# Patient Record
Sex: Female | Born: 1960 | Race: White | Hispanic: No | Marital: Married
Health system: Southern US, Community
[De-identification: ages and names within clinical notes are randomized; demographics above are authoritative.]

## PROBLEM LIST (undated history)

## (undated) DIAGNOSIS — I639 Cerebral infarction, unspecified: Secondary | ICD-10-CM

## (undated) DIAGNOSIS — H269 Unspecified cataract: Secondary | ICD-10-CM

## (undated) DIAGNOSIS — C50919 Malignant neoplasm of unspecified site of unspecified female breast: Secondary | ICD-10-CM

## (undated) DIAGNOSIS — E119 Type 2 diabetes mellitus without complications: Secondary | ICD-10-CM

## (undated) DIAGNOSIS — I10 Essential (primary) hypertension: Secondary | ICD-10-CM

## (undated) HISTORY — PX: TUBAL LIGATION: SHX77

## (undated) HISTORY — DX: Unspecified cataract: H26.9

## (undated) HISTORY — DX: Cerebral infarction, unspecified: I63.9

## (undated) HISTORY — PX: BREAST EXCISIONAL BIOPSY: SUR124

## (undated) HISTORY — PX: ABDOMINAL HYSTERECTOMY: SHX81

## (undated) HISTORY — DX: Type 2 diabetes mellitus without complications: E11.9

## (undated) HISTORY — PX: BREAST SURGERY: SHX581

## (undated) HISTORY — DX: Essential (primary) hypertension: I10

## (undated) HISTORY — PX: REDUCTION MAMMAPLASTY: SUR839

## (undated) HISTORY — DX: Malignant neoplasm of unspecified site of unspecified female breast: C50.919

## (undated) HISTORY — PX: MASTECTOMY: SHX3

---

## 2003-01-25 HISTORY — PX: BUNIONECTOMY: SHX129

## 2004-05-20 ENCOUNTER — Ambulatory Visit: Payer: Self-pay | Admitting: Podiatry

## 2012-07-26 DIAGNOSIS — N951 Menopausal and female climacteric states: Secondary | ICD-10-CM | POA: Insufficient documentation

## 2012-07-26 DIAGNOSIS — L570 Actinic keratosis: Secondary | ICD-10-CM | POA: Insufficient documentation

## 2012-07-26 DIAGNOSIS — Z86000 Personal history of in-situ neoplasm of breast: Secondary | ICD-10-CM | POA: Insufficient documentation

## 2012-07-26 DIAGNOSIS — I10 Essential (primary) hypertension: Secondary | ICD-10-CM | POA: Insufficient documentation

## 2012-07-26 DIAGNOSIS — E785 Hyperlipidemia, unspecified: Secondary | ICD-10-CM | POA: Insufficient documentation

## 2012-07-26 DIAGNOSIS — Z9889 Other specified postprocedural states: Secondary | ICD-10-CM | POA: Insufficient documentation

## 2012-07-26 DIAGNOSIS — E1169 Type 2 diabetes mellitus with other specified complication: Secondary | ICD-10-CM | POA: Insufficient documentation

## 2012-07-26 DIAGNOSIS — E11319 Type 2 diabetes mellitus with unspecified diabetic retinopathy without macular edema: Secondary | ICD-10-CM | POA: Insufficient documentation

## 2012-11-01 DIAGNOSIS — Z87891 Personal history of nicotine dependence: Secondary | ICD-10-CM | POA: Insufficient documentation

## 2014-01-28 DIAGNOSIS — D219 Benign neoplasm of connective and other soft tissue, unspecified: Secondary | ICD-10-CM | POA: Insufficient documentation

## 2014-07-25 HISTORY — PX: RECONSTRUCTION BREAST W/ TRAM FLAP: SUR1079

## 2014-09-16 DIAGNOSIS — Z9889 Other specified postprocedural states: Secondary | ICD-10-CM | POA: Insufficient documentation

## 2015-02-17 LAB — HM PAP SMEAR

## 2015-08-27 DIAGNOSIS — E669 Obesity, unspecified: Secondary | ICD-10-CM | POA: Insufficient documentation

## 2015-08-27 DIAGNOSIS — E6609 Other obesity due to excess calories: Secondary | ICD-10-CM | POA: Insufficient documentation

## 2015-11-25 DIAGNOSIS — Z90710 Acquired absence of both cervix and uterus: Secondary | ICD-10-CM | POA: Insufficient documentation

## 2015-12-31 DIAGNOSIS — Z8669 Personal history of other diseases of the nervous system and sense organs: Secondary | ICD-10-CM | POA: Insufficient documentation

## 2016-07-05 DIAGNOSIS — L819 Disorder of pigmentation, unspecified: Secondary | ICD-10-CM | POA: Insufficient documentation

## 2016-07-05 DIAGNOSIS — K219 Gastro-esophageal reflux disease without esophagitis: Secondary | ICD-10-CM | POA: Insufficient documentation

## 2016-07-05 DIAGNOSIS — K5730 Diverticulosis of large intestine without perforation or abscess without bleeding: Secondary | ICD-10-CM | POA: Insufficient documentation

## 2016-07-05 DIAGNOSIS — J410 Simple chronic bronchitis: Secondary | ICD-10-CM | POA: Insufficient documentation

## 2017-04-14 LAB — HM HEPATITIS C SCREENING LAB: HM HEPATITIS C SCREENING: NEGATIVE

## 2017-04-14 LAB — HEMOGLOBIN A1C: Hemoglobin A1C: 6.6

## 2017-04-14 LAB — LIPID PANEL
Cholesterol: 116 (ref 0–200)
HDL: 58 (ref 35–70)
LDL Cholesterol: 36
Triglycerides: 112 (ref 40–160)

## 2017-07-19 LAB — HM MAMMOGRAPHY

## 2018-02-19 ENCOUNTER — Ambulatory Visit (INDEPENDENT_AMBULATORY_CARE_PROVIDER_SITE_OTHER): Payer: 59 | Admitting: Family Medicine

## 2018-02-19 ENCOUNTER — Encounter: Payer: Self-pay | Admitting: Family Medicine

## 2018-02-19 VITALS — BP 117/70 | HR 84 | Temp 98.1°F | Ht 62.5 in | Wt 221.6 lb

## 2018-02-19 DIAGNOSIS — Z9889 Other specified postprocedural states: Secondary | ICD-10-CM

## 2018-02-19 DIAGNOSIS — E11319 Type 2 diabetes mellitus with unspecified diabetic retinopathy without macular edema: Secondary | ICD-10-CM | POA: Diagnosis not present

## 2018-02-19 DIAGNOSIS — Z86010 Personal history of colonic polyps: Secondary | ICD-10-CM

## 2018-02-19 DIAGNOSIS — E785 Hyperlipidemia, unspecified: Secondary | ICD-10-CM

## 2018-02-19 DIAGNOSIS — Z87891 Personal history of nicotine dependence: Secondary | ICD-10-CM

## 2018-02-19 DIAGNOSIS — E1169 Type 2 diabetes mellitus with other specified complication: Secondary | ICD-10-CM | POA: Diagnosis not present

## 2018-02-19 DIAGNOSIS — N951 Menopausal and female climacteric states: Secondary | ICD-10-CM

## 2018-02-19 DIAGNOSIS — E669 Obesity, unspecified: Secondary | ICD-10-CM | POA: Diagnosis not present

## 2018-02-19 DIAGNOSIS — Z1211 Encounter for screening for malignant neoplasm of colon: Secondary | ICD-10-CM

## 2018-02-19 DIAGNOSIS — I10 Essential (primary) hypertension: Secondary | ICD-10-CM

## 2018-02-19 MED ORDER — INSULIN DEGLUDEC-LIRAGLUTIDE 100-3.6 UNIT-MG/ML ~~LOC~~ SOPN: 25.0000 [IU] | PEN_INJECTOR | Freq: Every day | SUBCUTANEOUS | 3 refills | Status: DC

## 2018-02-19 MED ORDER — DULAGLUTIDE 1.5 MG/0.5ML ~~LOC~~ SOAJ: 1.5000 mg | SUBCUTANEOUS | 3 refills | Status: DC

## 2018-02-19 NOTE — Patient Instructions (Signed)
Diabetes Mellitus and Nutrition, Adult  When you have diabetes (diabetes mellitus), it is very important to have healthy eating habits because your blood sugar (glucose) levels are greatly affected by what you eat and drink. Eating healthy foods in the appropriate amounts, at about the same times every day, can help you:  · Control your blood glucose.  · Lower your risk of heart disease.  · Improve your blood pressure.  · Reach or maintain a healthy weight.  Every person with diabetes is different, and each person has different needs for a meal plan. Your health care provider may recommend that you work with a diet and nutrition specialist (dietitian) to make a meal plan that is best for you. Your meal plan may vary depending on factors such as:  · The calories you need.  · The medicines you take.  · Your weight.  · Your blood glucose, blood pressure, and cholesterol levels.  · Your activity level.  · Other health conditions you have, such as heart or kidney disease.  How do carbohydrates affect me?  Carbohydrates, also called carbs, affect your blood glucose level more than any other type of food. Eating carbs naturally raises the amount of glucose in your blood. Carb counting is a method for keeping track of how many carbs you eat. Counting carbs is important to keep your blood glucose at a healthy level, especially if you use insulin or take certain oral diabetes medicines.  It is important to know how many carbs you can safely have in each meal. This is different for every person. Your dietitian can help you calculate how many carbs you should have at each meal and for each snack.  Foods that contain carbs include:  · Bread, cereal, rice, pasta, and crackers.  · Potatoes and corn.  · Peas, beans, and lentils.  · Milk and yogurt.  · Fruit and juice.  · Desserts, such as cakes, cookies, ice cream, and candy.  How does alcohol affect me?  Alcohol can cause a sudden decrease in blood glucose (hypoglycemia),  especially if you use insulin or take certain oral diabetes medicines. Hypoglycemia can be a life-threatening condition. Symptoms of hypoglycemia (sleepiness, dizziness, and confusion) are similar to symptoms of having too much alcohol.  If your health care provider says that alcohol is safe for you, follow these guidelines:  · Limit alcohol intake to no more than 1 drink per day for nonpregnant women and 2 drinks per day for men. One drink equals 12 oz of beer, 5 oz of wine, or 1½ oz of hard liquor.  · Do not drink on an empty stomach.  · Keep yourself hydrated with water, diet soda, or unsweetened iced tea.  · Keep in mind that regular soda, juice, and other mixers may contain a lot of sugar and must be counted as carbs.  What are tips for following this plan?    Reading food labels  · Start by checking the serving size on the "Nutrition Facts" label of packaged foods and drinks. The amount of calories, carbs, fats, and other nutrients listed on the label is based on one serving of the item. Many items contain more than one serving per package.  · Check the total grams (g) of carbs in one serving. You can calculate the number of servings of carbs in one serving by dividing the total carbs by 15. For example, if a food has 30 g of total carbs, it would be equal to 2   servings of carbs.  · Check the number of grams (g) of saturated and trans fats in one serving. Choose foods that have low or no amount of these fats.  · Check the number of milligrams (mg) of salt (sodium) in one serving. Most people should limit total sodium intake to less than 2,300 mg per day.  · Always check the nutrition information of foods labeled as "low-fat" or "nonfat". These foods may be higher in added sugar or refined carbs and should be avoided.  · Talk to your dietitian to identify your daily goals for nutrients listed on the label.  Shopping  · Avoid buying canned, premade, or processed foods. These foods tend to be high in fat, sodium,  and added sugar.  · Shop around the outside edge of the grocery store. This includes fresh fruits and vegetables, bulk grains, fresh meats, and fresh dairy.  Cooking  · Use low-heat cooking methods, such as baking, instead of high-heat cooking methods like deep frying.  · Cook using healthy oils, such as olive, canola, or sunflower oil.  · Avoid cooking with butter, cream, or high-fat meats.  Meal planning  · Eat meals and snacks regularly, preferably at the same times every day. Avoid going long periods of time without eating.  · Eat foods high in fiber, such as fresh fruits, vegetables, beans, and whole grains. Talk to your dietitian about how many servings of carbs you can eat at each meal.  · Eat 4-6 ounces (oz) of lean protein each day, such as lean meat, chicken, fish, eggs, or tofu. One oz of lean protein is equal to:  ? 1 oz of meat, chicken, or fish.  ? 1 egg.  ? ¼ cup of tofu.  · Eat some foods each day that contain healthy fats, such as avocado, nuts, seeds, and fish.  Lifestyle  · Check your blood glucose regularly.  · Exercise regularly as told by your health care provider. This may include:  ? 150 minutes of moderate-intensity or vigorous-intensity exercise each week. This could be brisk walking, biking, or water aerobics.  ? Stretching and doing strength exercises, such as yoga or weightlifting, at least 2 times a week.  · Take medicines as told by your health care provider.  · Do not use any products that contain nicotine or tobacco, such as cigarettes and e-cigarettes. If you need help quitting, ask your health care provider.  · Work with a counselor or diabetes educator to identify strategies to manage stress and any emotional and social challenges.  Questions to ask a health care provider  · Do I need to meet with a diabetes educator?  · Do I need to meet with a dietitian?  · What number can I call if I have questions?  · When are the best times to check my blood glucose?  Where to find more  information:  · American Diabetes Association: diabetes.org  · Academy of Nutrition and Dietetics: www.eatright.org  · National Institute of Diabetes and Digestive and Kidney Diseases (NIH): www.niddk.nih.gov  Summary  · A healthy meal plan will help you control your blood glucose and maintain a healthy lifestyle.  · Working with a diet and nutrition specialist (dietitian) can help you make a meal plan that is best for you.  · Keep in mind that carbohydrates (carbs) and alcohol have immediate effects on your blood glucose levels. It is important to count carbs and to use alcohol carefully.  This information is not intended to   replace advice given to you by your health care provider. Make sure you discuss any questions you have with your health care provider.  Document Released: 10/07/2004 Document Revised: 08/10/2016 Document Reviewed: 02/15/2016  Elsevier Interactive Patient Education © 2019 Elsevier Inc.

## 2018-02-19 NOTE — Progress Notes (Signed)
Patient: Melissa Hayes, Female    DOB: 1960/08/30, 58 y.o.   MRN: [id number] Visit Date: 02/19/2018  Today's Provider: Lavon Paganini, MD   Chief Complaint  Patient presents with  . Establish Care   Subjective:  I, Tiburcio Pea, CMA, am acting as a scribe for Lavon Paganini, MD.    New Patient:  Melissa Hayes is a 58 y.o. female who presents today to Establish Care. She was previously a patient at [facility] in [place].  She feels well. She reports exercising lightly. She reports she is sleeping well.  Patient states that her main medical concern is her obesity.  She would like to lose weight.  Since moving within the last few months, she has been eating out more.  She is trying to cook more at home and eat less outside the house.  She is not exercising currently.  She feels like it is warm she is able to walk outside.  She feels like it is too far to go to the gym from her house.  She also admits that she was out of her insulin pen needles for several months after her move and therefore did not take her insulin during that time.  She has been back on this for about 1 month.  She tried Contrave in the past and found that this helped her quit smoking, but did not help with weight loss.  She was previously taking Bydureon and found this helpful.  Patient has a history of breast cancer in 1999.  She underwent a right mastectomy.  She did not require any chemo or radiation.  She has been cleared by oncology.  She does get annual mammograms.  She underwent reconstructive surgery requiring a flap that had several complications including an abscess.  She does still have some intermittent pain around the scarring that feels like a pulling sensation.  Patient has a history of stroke in 2016 with some vision loss in her right eye.  She also has cataracts, but they are waiting to do surgery given that she already has vision loss in one eye from the  stroke.  Patient has a history of hysterectomy, which may have been supracervical.  This was done for benign indications, DU B.  She has had Pap smears since that time and has no history of abnormal Pap smears  T2DM - Checking BG at home: Not currently - Medications: Insulin degludec-liraglutide 100-3.6 units-MGs per mL 0.25 mL's daily at bedtime, metformin 1000 mg twice daily, Actos 45 mg daily - Compliance: Good, but she was out of her insulin combo pen for about 2 months.  She has been back on this for 1 month - Diet: Working toward low-carb diet again - Exercise: None currently - eye exam: needs referral to Opth - foot exam: needs - microalbumin: on ARB - denies symptoms of hypoglycemia, polyuria, polydipsia, numbness extremities, foot ulcers/trauma  HTN: - Medications: Valsartan- HCTZ 160-25mg  daily - Compliance: good - Checking BP at home: no - Denies any SOB, CP, vision changes, LE edema, medication SEs, or symptoms of hypotension  Patient has hot flashes but not a candidate for HRT.  She is taking Paxil 10mg  daily.  -----------------------------------------------------------------   Review of Systems  Constitutional: Negative.   HENT: Negative.   Eyes: Negative.   Respiratory: Negative.   Cardiovascular: Negative.   Gastrointestinal: Negative.   Endocrine: Negative.   Genitourinary: Negative.   Musculoskeletal: Negative.   Skin: Negative.  Allergic/Immunologic: Negative.   Neurological: Negative.   Hematological: Negative.   Psychiatric/Behavioral: Negative.     Social History      She  reports that she has quit smoking. Her smoking use included cigarettes. She has never used smokeless tobacco. She reports previous alcohol use. She reports that she does not use drugs.       Social History   Socioeconomic History  . Marital status: Married    Spouse name: Not on file  . Number of children: Not on file  . Years of education: Not on file  . Highest education  level: Not on file  Occupational History  . Not on file  Social Needs  . Financial resource strain: Not on file  . Food insecurity:    Worry: Not on file    Inability: Not on file  . Transportation needs:    Medical: Not on file    Non-medical: Not on file  Tobacco Use  . Smoking status: Former Smoker    Types: Cigarettes  . Smokeless tobacco: Never Used  Substance and Sexual Activity  . Alcohol use: Not Currently  . Drug use: Never  . Sexual activity: Not on file  Lifestyle  . Physical activity:    Days per week: Not on file    Minutes per session: Not on file  . Stress: Not on file  Relationships  . Social connections:    Talks on phone: Not on file    Gets together: Not on file    Attends religious service: Not on file    Active member of club or organization: Not on file    Attends meetings of clubs or organizations: Not on file    Relationship status: Not on file  Other Topics Concern  . Not on file  Social History Narrative  . Not on file    Past Medical History:  Diagnosis Date  . Cancer ([place])   . Cataract   . Hypertension   . Stroke [facility])      Patient Active Problem List   Diagnosis Date Noted  . Diabetes mellitus type 2 in obese ([place]) 02/19/2018  . Diverticulosis of colon 07/05/2016  . Gastroesophageal reflux disease without esophagitis 07/05/2016  . Simple chronic bronchitis ([place]) 07/05/2016  . Change in pigmented skin lesion of face 07/05/2016  . History of central retinal artery occlusion 12/31/2015  . Hx of hysterectomy, total 11/25/2015  . Non morbid obesity due to excess calories 08/27/2015  . 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 Fibroids 01/28/2014  . Former smoker 11/01/2012  . Diabetic retinopathy ([place]) 07/26/2012  . H/O carcinoma in situ of breast 07/26/2012  . HTN (hypertension), benign 07/26/2012  . Hyperlipidemia LDL goal <100 07/26/2012  . Post menopausal syndrome 07/26/2012  . AK  (actinic keratosis) 07/26/2012  . H/O colonoscopy with polypectomy 07/26/2012    Past Surgical History:  Procedure Laterality Date  . ABDOMINAL HYSTERECTOMY    . BREAST SURGERY    . BUNIONECTOMY  2005  . CESAREAN SECTION    . RECONSTRUCTION BREAST W/ TRAM FLAP  07/2014  . TUBAL LIGATION      Family History        Family Status  Relation Name Status  . Mother  Alive  . Father  Alive  . Brother  Alive  . MGM  Deceased  . MGF  Deceased  . PGM  Deceased  . PGF  Deceased  Her family history includes Diabetes in her father, maternal grandmother, mother, and paternal grandmother; Hypertension in her father, maternal grandmother, mother, and paternal grandmother.      No Known Allergies   Current Outpatient Medications:  .  glucose blood test strip, USE 1 STRIP TO TEST BLOOD SUGAR DAILY AS DIRECTED., Disp: , Rfl:  .  Insulin Degludec-Liraglutide 100-3.6 UNIT-MG/ML SOPN, Inject into the skin., Disp: , Rfl:  .  metFORMIN (GLUCOPHAGE) 1000 MG tablet, , Disp: , Rfl:  .  PARoxetine (PAXIL) 10 MG tablet, , Disp: , Rfl:  .  pioglitazone (ACTOS) 45 MG tablet, , Disp: , Rfl:  .  rosuvastatin (CRESTOR) 20 MG tablet, TAKE 1 TABLET BY MOUTH EVERYDAY AT BEDTIME, Disp: , Rfl:  .  valsartan-hydrochlorothiazide (DIOVAN-HCT) 160-25 MG tablet, , Disp: , Rfl:    Patient Care Team: Virginia Crews, MD as PCP - General (Family Medicine)      Objective:   Vitals: BP 117/70 (BP Location: Right Arm, Patient Position: Sitting, Cuff Size: Large)   Pulse 84   Temp 98.1 F (36.7 C) (Oral)   Ht 5' 2.5" (1.588 m)   Wt 221 lb 9.6 oz (100.5 kg)   SpO2 99%   BMI 39.89 kg/m    Vitals:   02/19/18 1519  BP: 117/70  Pulse: 84  Temp: 98.1 F (36.7 C)  TempSrc: Oral  SpO2: 99%  Weight: 221 lb 9.6 oz (100.5 kg)  Height: 5' 2.5" (1.588 m)     Physical Exam Vitals signs reviewed.  Constitutional:      General: She is not in acute distress.    Appearance: Normal appearance. She is  well-developed. She is not diaphoretic.  HENT:     Head: Normocephalic and atraumatic.     Right Ear: Tympanic membrane, ear canal and external ear normal.     Left Ear: Tympanic membrane, ear canal and external ear normal.     Nose: Nose normal.     Mouth/Throat:     Mouth: Mucous membranes are moist.     Pharynx: Oropharynx is clear. No oropharyngeal exudate.  Eyes:     General: No scleral icterus.    Conjunctiva/sclera: Conjunctivae normal.     Pupils: Pupils are equal, round, and reactive to light.  Neck:     Musculoskeletal: Neck supple.     Thyroid: No thyromegaly.  Cardiovascular:     Rate and Rhythm: Normal rate and regular rhythm.     Pulses: Normal pulses.     Heart sounds: Normal heart sounds. No murmur.  Pulmonary:     Effort: Pulmonary effort is normal. No respiratory distress.     Breath sounds: Normal breath sounds. No wheezing or rales.  Abdominal:     General: Bowel sounds are normal. There is no distension.     Palpations: Abdomen is soft.     Tenderness: There is no abdominal tenderness. There is no guarding or rebound.  Musculoskeletal:        General: No deformity.     Right lower leg: No edema.     Left lower leg: No edema.  Lymphadenopathy:     Cervical: No cervical adenopathy.  Skin:    General: Skin is warm and dry.     Capillary Refill: Capillary refill takes less than 2 seconds.     Findings: No rash.  Neurological:     Mental Status: She is alert and oriented to person, place, and time. Mental status is at baseline.  Psychiatric:  Mood and Affect: Mood normal.        Behavior: Behavior normal.        Thought Content: Thought content normal.     Depression Screen PHQ 2/9 Scores 02/19/2018  PHQ - 2 Score 0  PHQ- 9 Score 0     Assessment & Plan:     Establish Care  Exercise Activities and Dietary recommendations Goals   None     Immunization History  Administered Date(s) Administered  . Influenza Split 12/30/2011  .  Influenza,inj,Quad PF,6+ Mos 01/07/2018  . Influenza,inj,quad, With Preservative 11/06/2014, 12/09/2015, 10/18/2016, 01/07/2018  . Influenza,trivalent, recombinat, inj, PF 11/01/2012, 11/06/2013  . Pneumococcal Conjugate-13 02/14/2014  . Pneumococcal Polysaccharide-23 05/25/2015  . Tdap 12/09/2015    Health Maintenance  Topic Date Due  . FOOT EXAM  02/03/1971  . OPHTHALMOLOGY EXAM  02/03/1971  . HIV Screening  02/03/1976  . COLONOSCOPY  02/03/2011  . HEMOGLOBIN A1C  10/15/2017  . PAP SMEAR-Modifier  02/16/2018  . MAMMOGRAM  07/20/2019  . TETANUS/TDAP  12/08/2025  . INFLUENZA VACCINE  Completed  . PNEUMOCOCCAL POLYSACCHARIDE VACCINE AGE 92-64 HIGH RISK  Completed  . Hepatitis C Screening  Completed     Discussed health benefits of physical activity, and encouraged her to engage in regular exercise appropriate for her age and condition.    --------------------------------------------------------------------  Problem List Items Addressed This Visit      Cardiovascular and Mediastinum   HTN (hypertension), benign - Primary    Well controlled Continue current medications Check CMP      Relevant Medications   rosuvastatin (CRESTOR) 20 MG tablet   valsartan-hydrochlorothiazide (DIOVAN-HCT) 160-25 MG tablet   aspirin EC 81 MG tablet   Other Relevant Orders   Comprehensive metabolic panel (Completed)     Endocrine   Diabetes mellitus type 2 in obese [facility])    previously well controlled Recheck A1c Foot exam at next visit Referral to Optho for annual eye exam Continue GLP1, insulin, metformin Will d/c Actos and add Jardiance to promote weight loss May be able to decrease or d/c insulin in the future Encourage exercise and low carb diet      Relevant Medications   metFORMIN (GLUCOPHAGE) 1000 MG tablet   rosuvastatin (CRESTOR) 20 MG tablet   valsartan-hydrochlorothiazide (DIOVAN-HCT) 160-25 MG tablet   aspirin EC 81 MG tablet   Insulin Degludec-Liraglutide 100-3.6  UNIT-MG/ML SOPN   empagliflozin (JARDIANCE) 10 MG TABS tablet   Other Relevant Orders   Hemoglobin A1c (Completed)   Diabetic retinopathy ([place])    Referral to Optho      Relevant Medications   metFORMIN (GLUCOPHAGE) 1000 MG tablet   rosuvastatin (CRESTOR) 20 MG tablet   valsartan-hydrochlorothiazide (DIOVAN-HCT) 160-25 MG tablet   aspirin EC 81 MG tablet   Insulin Degludec-Liraglutide 100-3.6 UNIT-MG/ML SOPN   empagliflozin (JARDIANCE) 10 MG TABS tablet   Other Relevant Orders   Ambulatory referral to Ophthalmology     Other   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    Continues to have some pain around scar No signs of infection or abscess Could see Surgery about this, but we will continue to monitor for now      Hyperlipidemia LDL goal <100    Continue Crestor 20 mg daily Well-controlled on last lipid panel Recheck CMP      Relevant Medications   rosuvastatin (CRESTOR) 20 MG tablet   valsartan-hydrochlorothiazide (DIOVAN-HCT) 160-25 MG tablet   aspirin EC 81 MG tablet   Post menopausal  syndrome    Well-controlled Continue Paxil 10 mg daily Avoid hormone replacement therapy      H/O colonoscopy with polypectomy    Referral for GI for repeat colonoscopy      Relevant Orders   Ambulatory referral to Gastroenterology   Former smoker    Given the patient quit smoking within the last 15 years and has extensive pack-year history, she will qualify for lung cancer screening Referral placed today      Relevant Orders   Ambulatory Referral for Lung Cancer Scre   Morbid obesity ([employer])    Discussed importance of healthy weight management Discussed importance of diet and exercise We will add Jardiance as above to her diabetic regimen She is already taking a GLP-1, but not at max dose as she is taking a lower dose of her insulin combo May consider changing this in the future      Relevant Medications   metFORMIN (GLUCOPHAGE) 1000 MG  tablet   Insulin Degludec-Liraglutide 100-3.6 UNIT-MG/ML SOPN   empagliflozin (JARDIANCE) 10 MG TABS tablet    Other Visit Diagnoses    Screen for colon cancer       Relevant Orders   Ambulatory referral to Gastroenterology       Return in about 3 months (around 05/21/2018) for CPE/DM f/u.   The entirety of the information documented in the History of Present Illness, Review of Systems and Physical Exam were personally obtained by me. Portions of this information were initially documented by Tiburcio Pea, CMA and reviewed by me for thoroughness and accuracy.    Virginia Crews, MD, MPH [facility] 02/21/2018 3:06 PM

## 2018-02-20 ENCOUNTER — Telehealth: Payer: Self-pay | Admitting: *Deleted

## 2018-02-20 DIAGNOSIS — Z87891 Personal history of nicotine dependence: Secondary | ICD-10-CM

## 2018-02-20 DIAGNOSIS — Z122 Encounter for screening for malignant neoplasm of respiratory organs: Secondary | ICD-10-CM

## 2018-02-20 LAB — COMPREHENSIVE METABOLIC PANEL
A/G RATIO: 1.3 (ref 1.2–2.2)
ALT: 10 IU/L (ref 0–32)
AST: 12 IU/L (ref 0–40)
Albumin: 4.1 g/dL (ref 3.8–4.9)
Alkaline Phosphatase: 43 IU/L (ref 39–117)
BUN/Creatinine Ratio: 16 (ref 9–23)
BUN: 14 mg/dL (ref 6–24)
Bilirubin Total: 0.7 mg/dL (ref 0.0–1.2)
CALCIUM: 10 mg/dL (ref 8.7–10.2)
CO2: 21 mmol/L (ref 20–29)
Chloride: 100 mmol/L (ref 96–106)
Creatinine, Ser: 0.88 mg/dL (ref 0.57–1.00)
GFR calc Af Amer: 84 mL/min/{1.73_m2} (ref >59)
GFR calc non Af Amer: 73 mL/min/{1.73_m2} (ref >59)
Globulin, Total: 3.1 g/dL (ref 1.5–4.5)
Glucose: 86 mg/dL (ref 65–99)
Potassium: 4 mmol/L (ref 3.5–5.2)
Sodium: 140 mmol/L (ref 134–144)
Total Protein: 7.2 g/dL (ref 6.0–8.5)

## 2018-02-20 LAB — HEMOGLOBIN A1C
Est. average glucose Bld gHb Est-mCnc: 143 mg/dL
Hgb A1c MFr Bld: 6.6 % — ABNORMAL HIGH (ref 4.8–5.6)

## 2018-02-20 MED ORDER — EMPAGLIFLOZIN 10 MG PO TABS: 10.0000 mg | ORAL_TABLET | Freq: Every day | ORAL | 5 refills | Status: DC

## 2018-02-20 NOTE — Telephone Encounter (Signed)
Received referral for initial lung cancer screening scan. Contacted patient and obtained smoking history,(former, quit 01/24/14, 80 pack year) as well as answering questions related to screening process. Patient denies signs of lung cancer such as weight loss or hemoptysis. Patient denies comorbidity that would prevent curative treatment if lung cancer were found. Patient is scheduled for shared decision making visit and CT scan on 02/27/18 at 215pm.

## 2018-02-21 NOTE — Assessment & Plan Note (Signed)
Referral for GI for repeat colonoscopy

## 2018-02-21 NOTE — Assessment & Plan Note (Signed)
previously well controlled Recheck A1c Foot exam at next visit Referral to Optho for annual eye exam Continue GLP1, insulin, metformin Will d/c Actos and add Jardiance to promote weight loss May be able to decrease or d/c insulin in the future Encourage exercise and low carb diet

## 2018-02-21 NOTE — Assessment & Plan Note (Signed)
Well controlled Continue current medications Check CMP 

## 2018-02-21 NOTE — Assessment & Plan Note (Signed)
Continues to have some pain around scar No signs of infection or abscess Could see Surgery about this, but we will continue to monitor for now

## 2018-02-21 NOTE — Assessment & Plan Note (Signed)
Well-controlled Continue Paxil 10 mg daily Avoid hormone replacement therapy

## 2018-02-21 NOTE — Assessment & Plan Note (Signed)
Discussed importance of healthy weight management Discussed importance of diet and exercise We will add Jardiance as above to her diabetic regimen She is already taking a GLP-1, but not at max dose as she is taking a lower dose of her insulin combo May consider changing this in the future

## 2018-02-21 NOTE — Assessment & Plan Note (Addendum)
Continue Crestor 20 mg daily Well-controlled on last lipid panel Recheck CMP

## 2018-02-21 NOTE — Assessment & Plan Note (Signed)
Given the patient quit smoking within the last 15 years and has extensive pack-year history, she will qualify for lung cancer screening Referral placed today

## 2018-02-21 NOTE — Assessment & Plan Note (Signed)
Referral to Optho

## 2018-02-26 ENCOUNTER — Telehealth: Payer: Self-pay | Admitting: *Deleted

## 2018-02-26 NOTE — Telephone Encounter (Signed)
Called pt to remind her of her appt for ldct screening on 02-27-2019 @1415 , voiced understanding.

## 2018-02-27 ENCOUNTER — Inpatient Hospital Stay: Payer: 59 | Attending: Oncology | Admitting: Oncology

## 2018-02-27 ENCOUNTER — Ambulatory Visit
Admission: RE | Admit: 2018-02-27 | Discharge: 2018-02-27 | Disposition: A | Discharge status: Home / Self Care | Payer: 59 | Source: Ambulatory Visit | Attending: Oncology | Admitting: Oncology

## 2018-02-27 DIAGNOSIS — Z122 Encounter for screening for malignant neoplasm of respiratory organs: Secondary | ICD-10-CM | POA: Diagnosis not present

## 2018-02-27 DIAGNOSIS — Z87891 Personal history of nicotine dependence: Secondary | ICD-10-CM | POA: Insufficient documentation

## 2018-02-27 NOTE — Progress Notes (Signed)
In accordance with CMS guidelines, patient has met eligibility criteria including age, absence of signs or symptoms of lung cancer.  Social History   Tobacco Use  . Smoking status: Former Smoker    Packs/day: 2.00    Years: 40.00    Pack years: 80.00    Types: Cigarettes    Last attempt to quit: 2016    Years since quitting: 4.0  . Smokeless tobacco: Never Used  Substance Use Topics  . Alcohol use: Not Currently  . Drug use: Never     A shared decision-making session was conducted prior to the performance of CT scan. This includes one or more decision aids, includes benefits and harms of screening, follow-up diagnostic testing, over-diagnosis, false positive rate, and total radiation exposure.  Counseling on the importance of adherence to annual lung cancer LDCT screening, impact of co-morbidities, and ability or willingness to undergo diagnosis and treatment is imperative for compliance of the program.  Counseling on the importance of continued smoking cessation for former smokers; the importance of smoking cessation for current smokers, and information about tobacco cessation interventions have been given to patient including [place] and 1800 quit [place] programs.  Written order for lung cancer screening with LDCT has been given to the patient and any and all questions have been answered to the best of my abilities.   Yearly follow up will be coordinated by Burgess Estelle, Thoracic Navigator.  Faythe Casa, NP 02/27/2018 2:47 PM

## 2018-02-28 ENCOUNTER — Encounter: Payer: Self-pay | Admitting: *Deleted

## 2018-03-02 ENCOUNTER — Telehealth: Payer: Self-pay | Admitting: *Deleted

## 2018-03-02 NOTE — Telephone Encounter (Signed)
Notified patient of LDCT lung cancer screening program results with recommendation for 12 month follow up imaging.  Also notified of incidental findings noted below and is encouraged to discuss further questions with PCP who will receive a copy of this not and/or the CT reports.  Patient verbalized understanding.     IMPRESSION: 1. Lung-RADS 2S, benign appearance or behavior. Continue annual screening with low-dose chest CT without contrast in 12 months. 2. The "S" modifier above refers to potentially clinically significant non lung cancer related findings. Specifically, there is aortic atherosclerosis, in addition to left anterior descending coronary artery disease. Please note that although the presence of coronary artery calcium documents the presence of coronary artery disease, the severity of this disease and any potential stenosis cannot be assessed on this non-gated CT examination. Assessment for potential risk factor modification, dietary therapy or pharmacologic therapy may be warranted, if clinically indicated. 3. Mild diffuse bronchial wall thickening with very mild centrilobular and paraseptal emphysema; imaging findings suggestive of underlying COPD.  Aortic Atherosclerosis (ICD10-I70.0) and Emphysema (ICD10-J43.9).   Electronically Signed   By: Vinnie Langton M.D.   On: 02/27/2018 16:11

## 2018-03-12 ENCOUNTER — Encounter: Payer: Self-pay | Admitting: *Deleted

## 2018-03-14 DIAGNOSIS — K219 Gastro-esophageal reflux disease without esophagitis: Secondary | ICD-10-CM | POA: Diagnosis not present

## 2018-03-14 DIAGNOSIS — Z8371 Family history of colonic polyps: Secondary | ICD-10-CM | POA: Diagnosis not present

## 2018-03-14 DIAGNOSIS — Z86010 Personal history of colonic polyps: Secondary | ICD-10-CM | POA: Diagnosis not present

## 2018-03-20 ENCOUNTER — Encounter: Payer: Self-pay | Admitting: Family Medicine

## 2018-03-20 DIAGNOSIS — H40001 Preglaucoma, unspecified, right eye: Secondary | ICD-10-CM | POA: Diagnosis not present

## 2018-03-20 DIAGNOSIS — E119 Type 2 diabetes mellitus without complications: Secondary | ICD-10-CM | POA: Diagnosis not present

## 2018-03-20 LAB — HM DIABETES EYE EXAM

## 2018-04-10 ENCOUNTER — Encounter: Payer: Self-pay | Admitting: Family Medicine

## 2018-04-10 DIAGNOSIS — Z8371 Family history of colonic polyps: Secondary | ICD-10-CM | POA: Diagnosis not present

## 2018-04-10 DIAGNOSIS — D126 Benign neoplasm of colon, unspecified: Secondary | ICD-10-CM | POA: Diagnosis not present

## 2018-04-10 DIAGNOSIS — Z1211 Encounter for screening for malignant neoplasm of colon: Secondary | ICD-10-CM | POA: Diagnosis not present

## 2018-04-10 DIAGNOSIS — D122 Benign neoplasm of ascending colon: Secondary | ICD-10-CM | POA: Diagnosis not present

## 2018-04-10 DIAGNOSIS — K635 Polyp of colon: Secondary | ICD-10-CM | POA: Diagnosis not present

## 2018-04-10 DIAGNOSIS — Z86010 Personal history of colonic polyps: Secondary | ICD-10-CM | POA: Diagnosis not present

## 2018-04-10 LAB — HM COLONOSCOPY

## 2018-05-17 ENCOUNTER — Other Ambulatory Visit: Payer: Self-pay

## 2018-05-17 MED ORDER — ROSUVASTATIN CALCIUM 20 MG PO TABS: 20.0000 mg | ORAL_TABLET | Freq: Every day | ORAL | 1 refills | Status: DC

## 2018-05-17 MED ORDER — PAROXETINE HCL 10 MG PO TABS: 10.0000 mg | ORAL_TABLET | Freq: Every day | ORAL | 1 refills | Status: DC

## 2018-05-17 MED ORDER — VALSARTAN-HYDROCHLOROTHIAZIDE 160-25 MG PO TABS: 1.0000 | ORAL_TABLET | Freq: Every day | ORAL | 1 refills | Status: DC

## 2018-05-17 NOTE — Telephone Encounter (Signed)
Patient called requesting a refill on Paxil, rosuvastatin and valsartan-hydrochlorothiazide. L.O.V. 02/19/2018, please advise.

## 2018-05-30 ENCOUNTER — Encounter: Payer: Self-pay | Admitting: Family Medicine

## 2018-07-26 ENCOUNTER — Ambulatory Visit (INDEPENDENT_AMBULATORY_CARE_PROVIDER_SITE_OTHER): Payer: 59 | Admitting: Family Medicine

## 2018-07-26 ENCOUNTER — Other Ambulatory Visit: Payer: Self-pay

## 2018-07-26 ENCOUNTER — Encounter: Payer: Self-pay | Admitting: Family Medicine

## 2018-07-26 VITALS — BP 115/71 | HR 75 | Temp 98.6°F | Ht 62.0 in | Wt 192.6 lb

## 2018-07-26 DIAGNOSIS — E669 Obesity, unspecified: Secondary | ICD-10-CM

## 2018-07-26 DIAGNOSIS — E1169 Type 2 diabetes mellitus with other specified complication: Secondary | ICD-10-CM | POA: Diagnosis not present

## 2018-07-26 DIAGNOSIS — E785 Hyperlipidemia, unspecified: Secondary | ICD-10-CM

## 2018-07-26 DIAGNOSIS — Z0000 Encounter for general adult medical examination without abnormal findings: Secondary | ICD-10-CM

## 2018-07-26 DIAGNOSIS — J410 Simple chronic bronchitis: Secondary | ICD-10-CM

## 2018-07-26 DIAGNOSIS — Z853 Personal history of malignant neoplasm of breast: Secondary | ICD-10-CM

## 2018-07-26 DIAGNOSIS — Z90710 Acquired absence of both cervix and uterus: Secondary | ICD-10-CM

## 2018-07-26 DIAGNOSIS — I10 Essential (primary) hypertension: Secondary | ICD-10-CM | POA: Diagnosis not present

## 2018-07-26 DIAGNOSIS — Z1239 Encounter for other screening for malignant neoplasm of breast: Secondary | ICD-10-CM

## 2018-07-26 MED ORDER — VALSARTAN 80 MG PO TABS: 80.0000 mg | ORAL_TABLET | Freq: Every day | ORAL | 2 refills | Status: DC

## 2018-07-26 MED ORDER — HYDROCHLOROTHIAZIDE 25 MG PO TABS: 25.0000 mg | ORAL_TABLET | Freq: Every day | ORAL | 3 refills | Status: DC

## 2018-07-26 MED ORDER — JARDIANCE 10 MG PO TABS: 10.0000 mg | ORAL_TABLET | Freq: Every day | ORAL | 3 refills | Status: DC

## 2018-07-26 MED ORDER — ROSUVASTATIN CALCIUM 20 MG PO TABS: 20.0000 mg | ORAL_TABLET | Freq: Every day | ORAL | 1 refills | Status: DC

## 2018-07-26 MED ORDER — METFORMIN HCL 1000 MG PO TABS: 1000.0000 mg | ORAL_TABLET | Freq: Two times a day (BID) | ORAL | 3 refills | Status: DC

## 2018-07-26 MED ORDER — PAROXETINE HCL 10 MG PO TABS: 10.0000 mg | ORAL_TABLET | Freq: Every day | ORAL | 1 refills | Status: DC

## 2018-07-26 MED ORDER — INSULIN DEGLUDEC-LIRAGLUTIDE 100-3.6 UNIT-MG/ML ~~LOC~~ SOPN: 36.0000 [IU] | PEN_INJECTOR | Freq: Every day | SUBCUTANEOUS | 3 refills | Status: DC

## 2018-07-26 NOTE — Progress Notes (Signed)
Patient: Melissa Hayes, Female    DOB: 09-Sep-1960, 58 y.o.   MRN: [id number] Visit Date: 07/26/2018  Today's Provider: Lavon Paganini, MD   Chief Complaint  Patient presents with  . Annual Exam   Subjective:    I, Porsha McClurkin CMA, am acting as a scribe for Lavon Paganini, MD.    Annual physical exam Melissa Hayes is a 58 y.o. female who presents today for health maintenance and complete physical. She feels well. She reports exercising intermittently. She reports she is sleeping well.  ----------------------------------------------------------------- Last Pap:02/17/2015 - s/p hysterectomy, unsure if she has a cervix. Benign indications. Last mammogram:07/19/2017 Colonoscopy at [facility] in 03/2018 - need to get results  T2DM - Checking BG at home: yes, fasting 90s-130 - Medications: jardiance 10mg  daily, metformin 1000mg  BID, Xultophy 26 units daily - Compliance: good - Diet: low carb - eye exam: UTD - foot exam: needs today - microalbumin: n/a ARB - denies symptoms of hypoglycemia, polyuria, polydipsia, numbness extremities, foot ulcers/trauma   HTN: - Medications: valsartan-hctz 160-25mg  daily - Compliance: good - but has to skip doses of medication when BP is low (3-4 times weekly) - Checking BP at home: yes - fluctuating, sometimes in the 100s/60s - Denies any SOB, CP, vision changes, LE edema, medication SEs, or symptoms of hypotension - Diet: low sodium  HLD - medications: crestor 20mg  daily - compliance: good - medication SEs: none   Review of Systems  Constitutional: Negative.   HENT: Negative.   Eyes: Negative.   Respiratory: Negative.   Cardiovascular: Negative.   Gastrointestinal: Negative.   Endocrine: Negative.   Genitourinary: Negative.   Musculoskeletal: Negative.   Skin: Negative.   Allergic/Immunologic: Negative.   Neurological: Negative.   Hematological: Negative.   Psychiatric/Behavioral: Negative.     Social History     She  reports that she quit smoking about 4 years ago. Her smoking use included cigarettes. She has a 80.00 pack-year smoking history. She has never used smokeless tobacco. She reports previous alcohol use. She reports that she does not use drugs.       Social History   Socioeconomic History  . Marital status: Married    Spouse name: Not on file  . Number of children: 0  . Years of education: Not on file  . Highest education level: Not on file  Occupational History  . Occupation: Agricultural consultant  Social Needs  . Financial resource strain: Not on file  . Food insecurity    Worry: Not on file    Inability: Not on file  . Transportation needs    Medical: Not on file    Non-medical: Not on file  Tobacco Use  . Smoking status: Former Smoker    Packs/day: 2.00    Years: 40.00    Pack years: 80.00    Types: Cigarettes    Quit date: 2016    Years since quitting: 4.5  . Smokeless tobacco: Never Used  Substance and Sexual Activity  . Alcohol use: Not Currently  . Drug use: Never  . Sexual activity: Not on file  Lifestyle  . Physical activity    Days per week: Not on file    Minutes per session: Not on file  . Stress: Not on file  Relationships  . Social Herbalist on phone: Not on file    Gets together: Not on file    Attends religious service: Not on file    Active  member of club or organization: Not on file    Attends meetings of clubs or organizations: Not on file    Relationship status: Not on file  Other Topics Concern  . Not on file  Social History Narrative  . Not on file    Past Medical History:  Diagnosis Date  . Breast cancer ([place])   . Cataract   . Diabetes ([place])   . Hypertension   . Stroke [facility])      Patient Active Problem List   Diagnosis Date Noted  . Morbid obesity ([place]) 02/21/2018  . Diabetes mellitus type 2 in obese ([place]) 02/19/2018  . Diverticulosis of colon 07/05/2016  . Gastroesophageal reflux disease without esophagitis  07/05/2016  . Simple chronic bronchitis ([place]) 07/05/2016  . Change in pigmented skin lesion of face 07/05/2016  . History of central retinal artery occlusion 12/31/2015  . Hx of hysterectomy, total 11/25/2015  . 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 Fibroids 01/28/2014  . Former smoker 11/01/2012  . Diabetic retinopathy ([place]) 07/26/2012  . H/O carcinoma in situ of breast 07/26/2012  . HTN (hypertension), benign 07/26/2012  . Hyperlipidemia associated with type 2 diabetes mellitus ([place]) 07/26/2012  . Post menopausal syndrome 07/26/2012  . AK (actinic keratosis) 07/26/2012  . H/O colonoscopy with polypectomy 07/26/2012    Past Surgical History:  Procedure Laterality Date  . ABDOMINAL HYSTERECTOMY    . BREAST SURGERY    . BUNIONECTOMY  2005  . RECONSTRUCTION BREAST W/ TRAM FLAP  07/2014  . TUBAL LIGATION      Family History        Family Status  Relation Name Status  . Mother  Alive  . Father  Alive  . Brother  Alive  . MGM  Deceased  . MGF  Deceased  . PGM  Deceased  . PGF  Deceased  . Mat Aunt  (Not Specified)        Her family history includes Breast cancer in her maternal aunt; Diabetes in her father, maternal grandmother, mother, and paternal grandmother; Hypertension in her father, maternal grandmother, mother, and paternal grandmother.      No Known Allergies   Current Outpatient Medications:  .  aspirin EC 81 MG tablet, Take 81 mg by mouth daily., Disp: , Rfl:  .  empagliflozin (JARDIANCE) 10 MG TABS tablet, Take 10 mg by mouth daily., Disp: 90 tablet, Rfl: 3 .  glucose blood test strip, USE 1 STRIP TO TEST BLOOD SUGAR DAILY AS DIRECTED., Disp: , Rfl:  .  Insulin Degludec-Liraglutide 100-3.6 UNIT-MG/ML SOPN, Inject 36 Units into the skin at bedtime., Disp: 6 pen, Rfl: 3 .  metFORMIN (GLUCOPHAGE) 1000 MG tablet, Take 1 tablet (1,000 mg total) by mouth 2 (two) times daily with a meal., Disp: 180 tablet, Rfl: 3 .   PARoxetine (PAXIL) 10 MG tablet, Take 1 tablet (10 mg total) by mouth daily., Disp: 90 tablet, Rfl: 1 .  rosuvastatin (CRESTOR) 20 MG tablet, Take 1 tablet (20 mg total) by mouth daily., Disp: 90 tablet, Rfl: 1 .  hydrochlorothiazide (HYDRODIURIL) 25 MG tablet, Take 1 tablet (25 mg total) by mouth daily., Disp: 30 tablet, Rfl: 3 .  valsartan (DIOVAN) 80 MG tablet, Take 1 tablet (80 mg total) by mouth daily., Disp: 30 tablet, Rfl: 2   Patient Care Team: Virginia Crews, MD as PCP - General (Family Medicine)    Objective:    Vitals: BP 115/71 (BP Location: Left Arm, Patient Position:  Sitting, Cuff Size: Normal)   Pulse 75   Temp 98.6 F (37 C) (Oral)   Ht 5\' 2"  (1.575 m)   Wt 192 lb 9.6 oz (87.4 kg)   SpO2 97%   BMI 35.23 kg/m    Vitals:   07/26/18 1112  BP: 115/71  Pulse: 75  Temp: 98.6 F (37 C)  TempSrc: Oral  SpO2: 97%  Weight: 192 lb 9.6 oz (87.4 kg)  Height: 5\' 2"  (1.575 m)     Physical Exam Vitals signs reviewed.  Constitutional:      General: She is not in acute distress.    Appearance: Normal appearance. She is well-developed. She is not diaphoretic.  HENT:     Head: Normocephalic and atraumatic.     Right Ear: External ear normal.     Left Ear: External ear normal.     Nose: Nose normal.     Mouth/Throat:     Mouth: Mucous membranes are moist.     Pharynx: Oropharynx is clear. No oropharyngeal exudate.  Eyes:     General: No scleral icterus.    Conjunctiva/sclera: Conjunctivae normal.     Pupils: Pupils are equal, round, and reactive to light.  Neck:     Musculoskeletal: Neck supple.     Thyroid: No thyromegaly.  Cardiovascular:     Rate and Rhythm: Normal rate and regular rhythm.     Heart sounds: Normal heart sounds. No murmur.  Pulmonary:     Effort: Pulmonary effort is normal. No respiratory distress.     Breath sounds: Normal breath sounds. No wheezing or rales.  Abdominal:     General: Bowel sounds are normal. There is no distension.      Palpations: Abdomen is soft.     Tenderness: There is no abdominal tenderness. There is no guarding or rebound.  Genitourinary:    Comments: GYN:  External genitalia within normal limits.  Vaginal mucosa pink, moist, normal rugae.  Surgically absent uterus and cervix.  No adenexal masses b/l.    Musculoskeletal:        General: No deformity.     Right lower leg: No edema.     Left lower leg: No edema.  Lymphadenopathy:     Cervical: No cervical adenopathy.  Skin:    General: Skin is warm and dry.     Capillary Refill: Capillary refill takes less than 2 seconds.     Findings: No rash.  Neurological:     Mental Status: She is alert and oriented to person, place, and time.  Psychiatric:        Mood and Affect: Mood normal.        Behavior: Behavior normal.        Thought Content: Thought content normal.      Depression Screen PHQ 2/9 Scores 07/26/2018 02/19/2018  PHQ - 2 Score 0 0  PHQ- 9 Score 0 0       Assessment & Plan:     Routine Health Maintenance and Physical Exam  Exercise Activities and Dietary recommendations Goals   None     Immunization History  Administered Date(s) Administered  . Influenza Split 12/30/2011  . Influenza,inj,Quad PF,6+ Mos 01/07/2018  . Influenza,inj,quad, With Preservative 11/06/2014, 12/09/2015, 10/18/2016, 01/07/2018  . Influenza,trivalent, recombinat, inj, PF 11/01/2012, 11/06/2013  . Pneumococcal Conjugate-13 02/14/2014  . Pneumococcal Polysaccharide-23 05/25/2015  . Tdap 12/09/2015    Health Maintenance  Topic Date Due  . FOOT EXAM  02/03/1971  . COLONOSCOPY  02/03/2011  .  MAMMOGRAM  07/20/2018  . HIV Screening  02/20/2019 (Originally 02/03/1976)  . HEMOGLOBIN A1C  08/20/2018  . INFLUENZA VACCINE  08/25/2018  . OPHTHALMOLOGY EXAM  03/21/2019  . TETANUS/TDAP  12/08/2025  . PNEUMOCOCCAL POLYSACCHARIDE VACCINE AGE 20-64 HIGH RISK  Completed  . Hepatitis C Screening  Completed     Discussed health benefits of physical  activity, and encouraged her to engage in regular exercise appropriate for her age and condition.    --------------------------------------------------------------------  Problem List Items Addressed This Visit      Cardiovascular and Mediastinum   HTN (hypertension), benign    Well controlled today But reporting symptomatic hypotension occasionally at home causing her to have to skip her medicine every other day We will decrease her valsartan dose to 80 mg daily and leave her HCTZ dose at 25 mg daily She will monitor at home and let me know what her blood pressures look like over the next 2 to 4 weeks while she is taking medication consistently Recheck metabolic panel today Follow-up in 6 months      Relevant Medications   valsartan (DIOVAN) 80 MG tablet   hydrochlorothiazide (HYDRODIURIL) 25 MG tablet   rosuvastatin (CRESTOR) 20 MG tablet   Other Relevant Orders   Comprehensive metabolic panel     Respiratory   Simple chronic bronchitis (HCC)    Previously diagnosed with COPD/chronic bronchitis Previous smoker Not on any controller medications and not needing albuterol Advised on return precautions        Endocrine   Diabetes mellitus type 2 in obese [facility])    Previously well controlled Recheck A1c Foot exam completed today Up-to-date on eye exam and vaccinations Continue GLP-1, insulin, metformin, Jardiance Congratulated her on weight loss Encouraged exercise and low-carb diet Follow-up in 6 months      Relevant Medications   Insulin Degludec-Liraglutide 100-3.6 UNIT-MG/ML SOPN   valsartan (DIOVAN) 80 MG tablet   empagliflozin (JARDIANCE) 10 MG TABS tablet   metFORMIN (GLUCOPHAGE) 1000 MG tablet   rosuvastatin (CRESTOR) 20 MG tablet   Other Relevant Orders   Hemoglobin A1c   Hyperlipidemia associated with type 2 diabetes mellitus (HCC)    Continue Crestor 20 mg daily Well-controlled on last lipid panel Recheck CMP and lipid panel      Relevant Medications    Insulin Degludec-Liraglutide 100-3.6 UNIT-MG/ML SOPN   valsartan (DIOVAN) 80 MG tablet   empagliflozin (JARDIANCE) 10 MG TABS tablet   metFORMIN (GLUCOPHAGE) 1000 MG tablet   rosuvastatin (CRESTOR) 20 MG tablet     Other   Hx of hysterectomy, total    No need for further Pap smears      Morbid obesity ([place])    Discussed importance of healthy weight management Discussed importance of diet and exercise Congratulated her on her 30 pound weight loss since starting Jardiance      Relevant Medications   Insulin Degludec-Liraglutide 100-3.6 UNIT-MG/ML SOPN   empagliflozin (JARDIANCE) 10 MG TABS tablet   metFORMIN (GLUCOPHAGE) 1000 MG tablet   Other Relevant Orders   CBC with Differential/Platelet    Other Visit Diagnoses    Encounter for annual physical exam    -  Primary   Relevant Orders   CBC with Differential/Platelet   Screening for breast cancer       Relevant Orders   MM 3D SCREEN BREAST BILATERAL   History of breast cancer       Relevant Orders   MM 3D SCREEN BREAST BILATERAL  Return in about 6 months (around 01/26/2019) for chronic disease f/u.   The entirety of the information documented in the History of Present Illness, Review of Systems and Physical Exam were personally obtained by me. Portions of this information were initially documented by [facility] , CMA and reviewed by me for thoroughness and accuracy.    Charlyne Robertshaw, Dionne Bucy, MD MPH [facility]

## 2018-07-26 NOTE — Patient Instructions (Signed)

## 2018-07-26 NOTE — Assessment & Plan Note (Signed)
Continue Crestor 20 mg daily Well-controlled on last lipid panel Recheck CMP and lipid panel

## 2018-07-26 NOTE — Assessment & Plan Note (Addendum)
Well controlled today But reporting symptomatic hypotension occasionally at home causing her to have to skip her medicine every other day We will decrease her valsartan dose to 80 mg daily and leave her HCTZ dose at 25 mg daily She will monitor at home and let me know what her blood pressures look like over the next 2 to 4 weeks while she is taking medication consistently Recheck metabolic panel today Follow-up in 6 months

## 2018-07-26 NOTE — Assessment & Plan Note (Signed)
Discussed importance of healthy weight management Discussed importance of diet and exercise Congratulated her on her 30 pound weight loss since starting [place]

## 2018-07-26 NOTE — Assessment & Plan Note (Signed)
Previously diagnosed with COPD/chronic bronchitis Previous smoker Not on any controller medications and not needing albuterol Advised on return precautions

## 2018-07-26 NOTE — Assessment & Plan Note (Signed)
Previously well controlled Recheck A1c Foot exam completed today Up-to-date on eye exam and vaccinations Continue GLP-1, insulin, metformin, Jardiance Congratulated her on weight loss Encouraged exercise and low-carb diet Follow-up in 6 months

## 2018-07-26 NOTE — Assessment & Plan Note (Signed)
No need for further Pap smears

## 2018-07-27 LAB — LIPID PANEL
Chol/HDL Ratio: 2.2 ratio (ref 0.0–4.4)
Cholesterol, Total: 93 mg/dL — ABNORMAL LOW (ref 100–199)
HDL: 43 mg/dL (ref >39)
LDL Calculated: 23 mg/dL (ref 0–99)
Triglycerides: 134 mg/dL (ref 0–149)
VLDL Cholesterol Cal: 27 mg/dL (ref 5–40)

## 2018-07-27 LAB — COMPREHENSIVE METABOLIC PANEL
ALT: 11 IU/L (ref 0–32)
AST: 12 IU/L (ref 0–40)
Albumin/Globulin Ratio: 1.7 (ref 1.2–2.2)
Albumin: 4.3 g/dL (ref 3.8–4.9)
Alkaline Phosphatase: 35 IU/L — ABNORMAL LOW (ref 39–117)
BUN/Creatinine Ratio: 8 — ABNORMAL LOW (ref 9–23)
BUN: 8 mg/dL (ref 6–24)
Bilirubin Total: 0.6 mg/dL (ref 0.0–1.2)
CO2: 23 mmol/L (ref 20–29)
Calcium: 9.9 mg/dL (ref 8.7–10.2)
Chloride: 100 mmol/L (ref 96–106)
Creatinine, Ser: 1 mg/dL (ref 0.57–1.00)
GFR calc Af Amer: 72 mL/min/{1.73_m2} (ref >59)
GFR calc non Af Amer: 63 mL/min/{1.73_m2} (ref >59)
Globulin, Total: 2.5 g/dL (ref 1.5–4.5)
Glucose: 143 mg/dL — ABNORMAL HIGH (ref 65–99)
Potassium: 3.8 mmol/L (ref 3.5–5.2)
Sodium: 143 mmol/L (ref 134–144)
Total Protein: 6.8 g/dL (ref 6.0–8.5)

## 2018-07-27 LAB — CBC WITH DIFFERENTIAL/PLATELET
Basophils Absolute: 0.1 10*3/uL (ref 0.0–0.2)
Basos: 1 %
EOS (ABSOLUTE): 0.1 10*3/uL (ref 0.0–0.4)
Eos: 2 %
Hematocrit: 44.5 % (ref 34.0–46.6)
Hemoglobin: 14.9 g/dL (ref 11.1–15.9)
Immature Grans (Abs): 0 10*3/uL (ref 0.0–0.1)
Immature Granulocytes: 0 %
Lymphocytes Absolute: 2.2 10*3/uL (ref 0.7–3.1)
Lymphs: 28 %
MCH: 29.4 pg (ref 26.6–33.0)
MCHC: 33.5 g/dL (ref 31.5–35.7)
MCV: 88 fL (ref 79–97)
Monocytes Absolute: 0.5 10*3/uL (ref 0.1–0.9)
Monocytes: 6 %
Neutrophils Absolute: 5.2 10*3/uL (ref 1.4–7.0)
Neutrophils: 63 %
Platelets: 278 10*3/uL (ref 150–450)
RBC: 5.06 x10E6/uL (ref 3.77–5.28)
RDW: 14.3 % (ref 11.7–15.4)
WBC: 8.1 10*3/uL (ref 3.4–10.8)

## 2018-07-27 LAB — HEMOGLOBIN A1C
Est. average glucose Bld gHb Est-mCnc: 146 mg/dL
Hgb A1c MFr Bld: 6.7 % — ABNORMAL HIGH (ref 4.8–5.6)

## 2018-07-30 ENCOUNTER — Other Ambulatory Visit: Payer: Self-pay | Admitting: Family Medicine

## 2018-09-21 ENCOUNTER — Other Ambulatory Visit: Payer: Self-pay

## 2018-09-21 MED ORDER — INSULIN DEGLUDEC-LIRAGLUTIDE 100-3.6 UNIT-MG/ML ~~LOC~~ SOPN: 36.0000 [IU] | PEN_INJECTOR | Freq: Every day | SUBCUTANEOUS | 3 refills | Status: DC

## 2018-09-26 ENCOUNTER — Telehealth: Payer: Self-pay | Admitting: Family Medicine

## 2018-09-26 DIAGNOSIS — E1169 Type 2 diabetes mellitus with other specified complication: Secondary | ICD-10-CM

## 2018-09-26 MED ORDER — INSULIN DEGLUDEC-LIRAGLUTIDE 100-3.6 UNIT-MG/ML ~~LOC~~ SOPN: 36.0000 [IU] | PEN_INJECTOR | Freq: Every day | SUBCUTANEOUS | 3 refills | Status: DC

## 2018-09-26 NOTE — Telephone Encounter (Signed)
[facility] is stating they have never received the refill request for Insulin Degludec-Liraglutide 100-3.6 UNIT-MG/ML SOPN.  Please advise.  Thanks, [employer]

## 2018-09-26 NOTE — Telephone Encounter (Signed)
L.O.V. was 07/26/2018 and medication resend.

## 2018-09-27 ENCOUNTER — Other Ambulatory Visit: Payer: Self-pay

## 2018-09-27 DIAGNOSIS — E1169 Type 2 diabetes mellitus with other specified complication: Secondary | ICD-10-CM

## 2018-09-27 DIAGNOSIS — E669 Obesity, unspecified: Secondary | ICD-10-CM

## 2018-09-27 MED ORDER — INSULIN DEGLUDEC-LIRAGLUTIDE 100-3.6 UNIT-MG/ML ~~LOC~~ SOPN: 36.0000 [IU] | PEN_INJECTOR | Freq: Every day | SUBCUTANEOUS | 1 refills | Status: DC

## 2018-10-22 ENCOUNTER — Other Ambulatory Visit: Payer: Self-pay | Admitting: Family Medicine

## 2019-01-28 ENCOUNTER — Other Ambulatory Visit: Payer: Self-pay

## 2019-01-28 ENCOUNTER — Ambulatory Visit (INDEPENDENT_AMBULATORY_CARE_PROVIDER_SITE_OTHER): Payer: 59 | Admitting: Family Medicine

## 2019-01-28 ENCOUNTER — Encounter: Payer: Self-pay | Admitting: Family Medicine

## 2019-01-28 VITALS — BP 115/58 | HR 76 | Temp 96.9°F | Resp 16 | Ht 62.0 in | Wt 180.0 lb

## 2019-01-28 DIAGNOSIS — Z794 Long term (current) use of insulin: Secondary | ICD-10-CM | POA: Diagnosis not present

## 2019-01-28 DIAGNOSIS — E669 Obesity, unspecified: Secondary | ICD-10-CM

## 2019-01-28 DIAGNOSIS — E785 Hyperlipidemia, unspecified: Secondary | ICD-10-CM

## 2019-01-28 DIAGNOSIS — E11319 Type 2 diabetes mellitus with unspecified diabetic retinopathy without macular edema: Secondary | ICD-10-CM

## 2019-01-28 DIAGNOSIS — I10 Essential (primary) hypertension: Secondary | ICD-10-CM | POA: Diagnosis not present

## 2019-01-28 DIAGNOSIS — Z6832 Body mass index (BMI) 32.0-32.9, adult: Secondary | ICD-10-CM

## 2019-01-28 DIAGNOSIS — E1169 Type 2 diabetes mellitus with other specified complication: Secondary | ICD-10-CM | POA: Diagnosis not present

## 2019-01-28 LAB — POCT GLYCOSYLATED HEMOGLOBIN (HGB A1C)
Est. average glucose Bld gHb Est-mCnc: 137
Hemoglobin A1C: 6.4 % — AB (ref 4.0–5.6)

## 2019-01-28 MED ORDER — HYDROCHLOROTHIAZIDE 12.5 MG PO TABS: 12.5000 mg | ORAL_TABLET | Freq: Every day | ORAL | 5 refills | Status: DC

## 2019-01-28 NOTE — Progress Notes (Signed)
Patient: Melissa Hayes Female    DOB: 25-Feb-1960   59 y.o.   MRN: [id number] Visit Date: 01/28/2019  Today's Provider: Lavon Paganini, MD   Chief Complaint  Patient presents with  . Diabetes  . Hypertension  . Hyperlipidemia   Subjective:     HPI  Diabetes Mellitus Type II, Follow-up:   Lab Results  Component Value Date   HGBA1C 6.4 (A) 01/28/2019   HGBA1C 6.7 (H) 07/26/2018   HGBA1C 6.6 (H) 02/19/2018   Last seen for diabetes 6 months ago.  Management since then includes no change. She reports good compliance with treatment. She is not having side effects.  Current symptoms include none  Home blood sugar records: fasting range: not being checked  Episodes of hypoglycemia? no   Current Insulin Regimen: none Most Recent Eye Exam: 03/20/2018 Weight trend: stable Prior visit with dietician: no Current diet: in general, a "healthy" diet   Current exercise: walking  ------------------------------------------------------------------------   Hypertension, follow-up:  BP Readings from Last 3 Encounters:  01/28/19 (!) 115/58  07/26/18 115/71  02/19/18 117/70    She was last seen for hypertension 6 months ago.  BP at that visit was 115/71. Management since that visit includes decrease Valsartan to 80mg  daily, and continue HCTZ 25mg  daily .She reports good compliance with treatment. She is not having side effects.  She is exercising. She is not adherent to low salt diet.   Outside blood pressures are stable. She is experiencing none.  Patient denies chest pain, chest pressure/discomfort, irregular heart beat and palpitations.   Cardiovascular risk factors include diabetes mellitus, dyslipidemia, hypertension and obesity (BMI >= 30 kg/m2).  Use of agents associated with hypertension: none.   ------------------------------------------------------------------------    Lipid/Cholesterol, Follow-up:   Last seen for this 6 months ago.  Management  since that visit includes no change.  Last Lipid Panel:    Component Value Date/Time   CHOL 93 (L) 07/26/2018 1154   TRIG 134 07/26/2018 1154   HDL 43 07/26/2018 1154   CHOLHDL 2.2 07/26/2018 1154   LDLCALC 23 07/26/2018 1154    She reports good compliance with treatment. She is not having side effects.   Wt Readings from Last 3 Encounters:  01/28/19 180 lb (81.6 kg)  07/26/18 192 lb 9.6 oz (87.4 kg)  02/27/18 221 lb (100.2 kg)    ------------------------------------------------------------------------  No Known Allergies   Current Outpatient Medications:  .  aspirin EC 81 MG tablet, Take 81 mg by mouth daily., Disp: , Rfl:  .  empagliflozin (JARDIANCE) 10 MG TABS tablet, Take 10 mg by mouth daily., Disp: 90 tablet, Rfl: 3 .  glucose blood test strip, USE 1 STRIP TO TEST BLOOD SUGAR DAILY AS DIRECTED., Disp: , Rfl:  .  hydrochlorothiazide (HYDRODIURIL) 25 MG tablet, TAKE 1 TABLET BY MOUTH EVERY DAY, Disp: 90 tablet, Rfl: 1 .  Insulin Degludec-Liraglutide 100-3.6 UNIT-MG/ML SOPN, Inject 36 Units into the skin at bedtime., Disp: 15 pen, Rfl: 1 .  metFORMIN (GLUCOPHAGE) 1000 MG tablet, Take 1 tablet (1,000 mg total) by mouth 2 (two) times daily with a meal., Disp: 180 tablet, Rfl: 3 .  PARoxetine (PAXIL) 10 MG tablet, Take 1 tablet (10 mg total) by mouth daily., Disp: 90 tablet, Rfl: 1 .  rosuvastatin (CRESTOR) 20 MG tablet, Take 1 tablet (20 mg total) by mouth daily., Disp: 90 tablet, Rfl: 1 .  valsartan (DIOVAN) 80 MG tablet, TAKE 1 TABLET BY MOUTH EVERY DAY, Disp: 90 tablet,  Rfl: 1  Review of Systems  Constitutional: Negative.   Respiratory: Negative.   Cardiovascular: Negative.   Musculoskeletal: Negative.     Social History   Tobacco Use  . Smoking status: Former Smoker    Packs/day: 2.00    Years: 40.00    Pack years: 80.00    Types: Cigarettes    Quit date: 2016    Years since quitting: 5.0  . Smokeless tobacco: Never Used  Substance Use Topics  . Alcohol  use: Not Currently      Objective:   BP (!) 115/58 (BP Location: Left Arm, Patient Position: Sitting, Cuff Size: Large)   Pulse 76   Temp (!) 96.9 F (36.1 C) (Temporal)   Resp 16   Ht 5\' 2"  (1.575 m)   Wt 180 lb (81.6 kg)   BMI 32.92 kg/m  Vitals:   01/28/19 1552  BP: (!) 115/58  Pulse: 76  Resp: 16  Temp: (!) 96.9 F (36.1 C)  TempSrc: Temporal  Weight: 180 lb (81.6 kg)  Height: 5\' 2"  (1.575 m)  Body mass index is 32.92 kg/m.   Physical Exam Vitals reviewed.  Constitutional:      General: She is not in acute distress.    Appearance: Normal appearance. She is well-developed. She is not diaphoretic.  HENT:     Head: Normocephalic and atraumatic.  Eyes:     General: No scleral icterus.    Conjunctiva/sclera: Conjunctivae normal.  Neck:     Thyroid: No thyromegaly.  Cardiovascular:     Rate and Rhythm: Normal rate and regular rhythm.     Pulses: Normal pulses.     Heart sounds: Normal heart sounds. No murmur.  Pulmonary:     Effort: Pulmonary effort is normal. No respiratory distress.     Breath sounds: Normal breath sounds. No wheezing, rhonchi or rales.  Musculoskeletal:     Cervical back: Neck supple.     Right lower leg: No edema.     Left lower leg: No edema.  Lymphadenopathy:     Cervical: No cervical adenopathy.  Skin:    General: Skin is warm and dry.     Capillary Refill: Capillary refill takes less than 2 seconds.     Findings: No rash.  Neurological:     Mental Status: She is alert and oriented to person, place, and time. Mental status is at baseline.  Psychiatric:        Mood and Affect: Mood normal.        Behavior: Behavior normal.      Results for orders placed or performed in visit on 01/28/19  POCT HgB A1C  Result Value Ref Range   Hemoglobin A1C 6.4 (A) 4.0 - 5.6 %   Est. average glucose Bld gHb Est-mCnc 137        Assessment & Plan    Problem List Items Addressed This Visit      Cardiovascular and Mediastinum   HTN  (hypertension), benign    Well-controlled, but remains low normal She is previously having symptomatic hypotension, which she does not feel any more, but her diastolic blood pressure remains low Continue valsartan 80 mg daily Decrease HCTZ to 12.5 mg daily Recheck BMP Advised her on home monitoring of blood pressures At follow-up, if remains low, consider discontinuing HCTZ Suspect that her ongoing weight loss is contributing to her bleeding less antihypertensives      Relevant Medications   hydrochlorothiazide (HYDRODIURIL) 12.5 MG tablet   Other Relevant Orders  Basic Metabolic Panel (BMET)     Endocrine   Diabetes mellitus type 2 in obese ([place]) - Primary    Well-controlled with A1c 6.4 Associated with hypertension and hyperlipidemia No hypoglycemia Up-to-date on eye exam, foot exam, vaccinations Continue GLP-1, insulin, metformin, Jardiance at current doses Congratulated her on weight loss Encourage exercise and low-carb diet On statin and ARB Follow-up in 6 months      Diabetic retinopathy ([place])    Followed by ophthalmology Due for eye exam again next month      Hyperlipidemia associated with type 2 diabetes mellitus ([place])    Well-controlled on last lipid panel Continue Crestor Recheck CMP and FLP at CPE in 6 months        Other   Obesity    Congratulated her on her ongoing weight loss She is intentionally down 40 pounds in the last year She believes Vania Rea is helping with her weight loss as well          Return in about 6 months (around 07/28/2019) for CPE.   The entirety of the information documented in the History of Present Illness, Review of Systems and Physical Exam were personally obtained by me. Portions of this information were initially documented by Tiburcio Pea, CMA and reviewed by me for thoroughness and accuracy.    Vicci Reder, Dionne Bucy, MD MPH [facility]

## 2019-01-28 NOTE — Patient Instructions (Signed)
Reminder for mammogram and eye exam

## 2019-01-29 NOTE — Assessment & Plan Note (Signed)
Well-controlled with A1c 6.4 Associated with hypertension and hyperlipidemia No hypoglycemia Up-to-date on eye exam, foot exam, vaccinations Continue GLP-1, insulin, metformin, Jardiance at current doses Congratulated her on weight loss Encourage exercise and low-carb diet On statin and ARB Follow-up in 6 months

## 2019-01-29 NOTE — Assessment & Plan Note (Signed)
Well-controlled, but remains low normal She is previously having symptomatic hypotension, which she does not feel any more, but her diastolic blood pressure remains low Continue valsartan 80 mg daily Decrease HCTZ to 12.5 mg daily Recheck BMP Advised her on home monitoring of blood pressures At follow-up, if remains low, consider discontinuing HCTZ Suspect that her ongoing weight loss is contributing to her bleeding less antihypertensives

## 2019-01-29 NOTE — Assessment & Plan Note (Signed)
Followed by ophthalmology Due for eye exam again next month

## 2019-01-29 NOTE — Assessment & Plan Note (Signed)
Well-controlled on last lipid panel Continue Crestor Recheck CMP and FLP at CPE in 6 months

## 2019-01-29 NOTE — Assessment & Plan Note (Signed)
Congratulated her on her ongoing weight loss She is intentionally down 40 pounds in the last year She believes Vania Rea is helping with her weight loss as well

## 2019-02-19 ENCOUNTER — Other Ambulatory Visit: Payer: Self-pay | Admitting: Family Medicine

## 2019-02-21 ENCOUNTER — Telehealth: Payer: Self-pay | Admitting: *Deleted

## 2019-02-21 NOTE — Telephone Encounter (Signed)
Contacted in attempt to schedule lung screening scan. Patient requests to consider lung screening scan around mid year.

## 2019-04-09 ENCOUNTER — Other Ambulatory Visit: Payer: Self-pay | Admitting: Family Medicine

## 2019-06-01 ENCOUNTER — Other Ambulatory Visit: Payer: Self-pay | Admitting: Family Medicine

## 2019-06-11 ENCOUNTER — Telehealth: Payer: Self-pay

## 2019-06-11 DIAGNOSIS — Z87891 Personal history of nicotine dependence: Secondary | ICD-10-CM

## 2019-06-11 DIAGNOSIS — Z122 Encounter for screening for malignant neoplasm of respiratory organs: Secondary | ICD-10-CM

## 2019-06-11 NOTE — Telephone Encounter (Signed)
Patient has been notified that the low dose lung cancer screening CT scan is due currently or will be in near future.  Patient reports she will skip the CT scan this year and does not want to schedule.

## 2019-07-09 ENCOUNTER — Other Ambulatory Visit: Payer: Self-pay | Admitting: Family Medicine

## 2019-07-09 NOTE — Telephone Encounter (Signed)
Requested Prescriptions  Pending Prescriptions Disp Refills   rosuvastatin (CRESTOR) 20 MG tablet [Pharmacy Med Name: ROSUVASTATIN CALCIUM 20 MG TAB] 90 tablet 0    Sig: TAKE 1 TABLET BY MOUTH EVERY DAY     Cardiovascular:  Antilipid - Statins Failed - 07/09/2019  1:30 AM      Failed - Total Cholesterol in normal range and within 360 days    Cholesterol, Total  Date Value Ref Range Status  07/26/2018 93 (L) 100 - 199 mg/dL Final         Passed - LDL in normal range and within 360 days    LDL Calculated  Date Value Ref Range Status  07/26/2018 23 0 - 99 mg/dL Final         Passed - HDL in normal range and within 360 days    HDL  Date Value Ref Range Status  07/26/2018 43 >39 mg/dL Final         Passed - Triglycerides in normal range and within 360 days    Triglycerides  Date Value Ref Range Status  07/26/2018 134 0 - 149 mg/dL Final         Passed - Patient is not pregnant      Passed - Valid encounter within last 12 months    Recent Outpatient Visits          5 months ago Type 2 diabetes mellitus with other specified complication, with long-term current use of insulin ([place])   [facility] [place], Dionne Bucy, MD   11 months ago Encounter for annual physical exam   [facility] [place], Dionne Bucy, MD   1 year ago HTN (hypertension), benign   [facility] [place], Dionne Bucy, MD              PARoxetine (PAXIL) 10 MG tablet [Pharmacy Med Name: PAROXETINE HCL 10 MG TABLET] 90 tablet 0    Sig: TAKE 1 TABLET BY MOUTH EVERY DAY     Psychiatry:  Antidepressants - SSRI Passed - 07/09/2019  1:30 AM      Passed - Valid encounter within last 6 months    Recent Outpatient Visits          5 months ago Type 2 diabetes mellitus with other specified complication, with long-term current use of insulin [facility])   [facility] [place], Dionne Bucy, MD   11 months ago Encounter for annual physical exam   [facility] [place], Dionne Bucy, MD   1 year ago HTN (hypertension), benign   [facility], Dionne Bucy, MD

## 2019-07-12 ENCOUNTER — Other Ambulatory Visit: Payer: Self-pay | Admitting: Family Medicine

## 2019-07-12 DIAGNOSIS — E1169 Type 2 diabetes mellitus with other specified complication: Secondary | ICD-10-CM

## 2019-07-13 ENCOUNTER — Other Ambulatory Visit: Payer: Self-pay | Admitting: Family Medicine

## 2019-07-30 ENCOUNTER — Other Ambulatory Visit: Payer: Self-pay | Admitting: Family Medicine

## 2019-07-31 ENCOUNTER — Other Ambulatory Visit: Payer: Self-pay | Admitting: Family Medicine

## 2019-07-31 NOTE — Telephone Encounter (Signed)
Requested Prescriptions  Pending Prescriptions Disp Refills  . JARDIANCE 10 MG TABS tablet [Pharmacy Med Name: JARDIANCE 10 MG TABLET] 90 tablet 3    Sig: TAKE 1 TABLET BY MOUTH DAILY     Endocrinology:  Diabetes - SGLT2 Inhibitors Failed - 07/31/2019  1:30 AM      Failed - Cr in normal range and within 360 days    Creatinine, Ser  Date Value Ref Range Status  07/26/2018 1.00 0.57 - 1.00 mg/dL Final         Failed - LDL in normal range and within 360 days    LDL Calculated  Date Value Ref Range Status  07/26/2018 23 0 - 99 mg/dL Final         Failed - HBA1C is between 0 and 7.9 and within 180 days    Hemoglobin A1C  Date Value Ref Range Status  01/28/2019 6.4 (A) 4.0 - 5.6 % Final  04/14/2017 6.6  Final   Hgb A1c MFr Bld  Date Value Ref Range Status  07/26/2018 6.7 (H) 4.8 - 5.6 % Final    Comment:             Prediabetes: 5.7 - 6.4          Diabetes: >6.4          Glycemic control for adults with diabetes: <7.0          Failed - eGFR in normal range and within 360 days    GFR calc Af Amer  Date Value Ref Range Status  07/26/2018 72 >59 mL/min/1.73 Final   GFR calc non Af Amer  Date Value Ref Range Status  07/26/2018 63 >59 mL/min/1.73 Final         Failed - Valid encounter within last 6 months    Recent Outpatient Visits          6 months ago Type 2 diabetes mellitus with other specified complication, with long-term current use of insulin [facility])   [facility] [place], Dionne Bucy, MD   1 year ago Encounter for annual physical exam   [facility] [place], Dionne Bucy, MD   1 year ago HTN (hypertension), benign   [facility], Dionne Bucy, MD             Patient is due for office visit and labs.

## 2019-08-01 ENCOUNTER — Encounter: Payer: Self-pay | Admitting: Family Medicine

## 2019-08-19 LAB — HM COLONOSCOPY

## 2019-09-17 ENCOUNTER — Encounter: Payer: Self-pay | Admitting: Family Medicine

## 2019-10-10 ENCOUNTER — Other Ambulatory Visit: Payer: Self-pay | Admitting: Family Medicine

## 2019-10-10 NOTE — Telephone Encounter (Signed)
Requested  medications are  due for refill today yes  Requested medications are on the active medication list yes  Last refill 6/25  Last visit 01/2019  Future visit scheduled no  Notes to clinic Last visit for Diovan states come back in July, no visit scheduled, Do not see Paxil addressed in office visit, please assess.

## 2019-10-15 NOTE — Addendum Note (Signed)
Addended by: Lieutenant Diego on: 10/15/2019 09:48 AM   Modules accepted: Orders

## 2019-10-15 NOTE — Telephone Encounter (Signed)
Received referral from Dr. Ola Spurr for lung screening. Patient contacted and to be scheduled with patient's preference of dates. Current smoker, 81.5 pack year

## 2019-10-24 ENCOUNTER — Other Ambulatory Visit: Payer: Self-pay | Admitting: Family Medicine

## 2019-10-24 NOTE — Telephone Encounter (Signed)
Pt. Had CPE 07/2019

## 2019-11-18 ENCOUNTER — Other Ambulatory Visit: Payer: Self-pay

## 2019-11-18 ENCOUNTER — Ambulatory Visit
Admission: RE | Admit: 2019-11-18 | Discharge: 2019-11-18 | Disposition: A | Discharge status: Home / Self Care | Payer: 59 | Source: Ambulatory Visit | Attending: Oncology | Admitting: Oncology

## 2019-11-18 DIAGNOSIS — Z122 Encounter for screening for malignant neoplasm of respiratory organs: Secondary | ICD-10-CM | POA: Diagnosis present

## 2019-11-18 DIAGNOSIS — Z87891 Personal history of nicotine dependence: Secondary | ICD-10-CM | POA: Diagnosis not present

## 2019-11-21 ENCOUNTER — Encounter: Payer: Self-pay | Admitting: *Deleted

## 2019-12-01 ENCOUNTER — Other Ambulatory Visit: Payer: Self-pay | Admitting: Family Medicine

## 2019-12-01 DIAGNOSIS — E1169 Type 2 diabetes mellitus with other specified complication: Secondary | ICD-10-CM

## 2019-12-01 DIAGNOSIS — E669 Obesity, unspecified: Secondary | ICD-10-CM

## 2019-12-01 NOTE — Telephone Encounter (Signed)
Requested medication (s) are due for refill today: no  Requested medication (s) are on the active medication list: yes  Last refill:  09/02/2019  Future visit scheduled:no  Notes to clinic: overdue for follow up Patient maybe seeing different provider   Requested Prescriptions  Pending Prescriptions Disp Refills   [place] 100-3.6 UNIT-MG/ML SOPN [Pharmacy Med Name: XULTOPHY 100 UNIT-3.6MG /ML PEN]      Sig: INJECT 36 UNITS INTO THE SKIN AT BEDTIME.      Endocrinology:  Diabetes - Insulin + GLP-1 Receptor Agonist Combos 1 Failed - 12/01/2019  8:56 AM      Failed - HBA1C is between 0 and 7.9 and within 180 days    Hemoglobin A1C  Date Value Ref Range Status  01/28/2019 6.4 (A) 4.0 - 5.6 % Final  04/14/2017 6.6  Final   Hgb A1c MFr Bld  Date Value Ref Range Status  07/26/2018 6.7 (H) 4.8 - 5.6 % Final    Comment:             Prediabetes: 5.7 - 6.4          Diabetes: >6.4          Glycemic control for adults with diabetes: <7.0           Failed - Valid encounter within last 6 months    Recent Outpatient Visits           10 months ago Type 2 diabetes mellitus with other specified complication, with long-term current use of insulin [facility])   [facility], Dionne Bucy, MD   1 year ago Encounter for annual physical exam   [facility] [place], Dionne Bucy, MD   1 year ago HTN (hypertension), benign   [facility], Dionne Bucy, MD

## 2019-12-24 ENCOUNTER — Other Ambulatory Visit: Payer: Self-pay | Admitting: Family Medicine

## 2020-01-09 ENCOUNTER — Other Ambulatory Visit: Payer: Self-pay | Admitting: Infectious Diseases

## 2020-01-09 ENCOUNTER — Other Ambulatory Visit: Payer: Self-pay

## 2020-01-09 ENCOUNTER — Ambulatory Visit
Admission: RE | Admit: 2020-01-09 | Discharge: 2020-01-09 | Disposition: A | Discharge status: Home / Self Care | Payer: 59 | Source: Ambulatory Visit | Attending: Infectious Diseases | Admitting: Infectious Diseases

## 2020-01-09 DIAGNOSIS — R112 Nausea with vomiting, unspecified: Secondary | ICD-10-CM

## 2020-01-10 ENCOUNTER — Other Ambulatory Visit: Payer: Self-pay | Admitting: Infectious Diseases

## 2020-01-10 DIAGNOSIS — R112 Nausea with vomiting, unspecified: Secondary | ICD-10-CM

## 2020-01-15 ENCOUNTER — Ambulatory Visit
Admission: RE | Admit: 2020-01-15 | Discharge: 2020-01-15 | Disposition: A | Discharge status: Home / Self Care | Payer: 59 | Source: Ambulatory Visit | Attending: Infectious Diseases | Admitting: Infectious Diseases

## 2020-01-15 ENCOUNTER — Other Ambulatory Visit: Payer: Self-pay

## 2020-01-15 DIAGNOSIS — R112 Nausea with vomiting, unspecified: Secondary | ICD-10-CM

## 2020-01-15 MED ORDER — IOHEXOL 300 MG/ML  SOLN
100.0000 mL | Freq: Once | INTRAMUSCULAR | Status: AC | PRN
  Administered 2020-01-15: 100 mL via INTRAVENOUS

## 2020-01-26 ENCOUNTER — Other Ambulatory Visit: Payer: Self-pay | Admitting: Family Medicine

## 2020-12-07 ENCOUNTER — Other Ambulatory Visit: Payer: Self-pay | Admitting: *Deleted

## 2020-12-07 DIAGNOSIS — F17210 Nicotine dependence, cigarettes, uncomplicated: Secondary | ICD-10-CM

## 2021-05-10 ENCOUNTER — Telehealth: Payer: Self-pay | Admitting: Acute Care

## 2021-05-10 NOTE — Telephone Encounter (Signed)
Pt was contacted to schedule her yearly lung cancer screening. Pt stated that she no longer wished to participate in lung cancer screening. Note was faxed to Dr Ola Spurr to make him aware.  ?

## 2021-12-02 ENCOUNTER — Other Ambulatory Visit: Payer: Self-pay | Admitting: Infectious Diseases

## 2021-12-02 DIAGNOSIS — Z1231 Encounter for screening mammogram for malignant neoplasm of breast: Secondary | ICD-10-CM

## 2022-03-16 ENCOUNTER — Other Ambulatory Visit: Payer: Self-pay | Admitting: *Deleted

## 2022-03-16 DIAGNOSIS — Z122 Encounter for screening for malignant neoplasm of respiratory organs: Secondary | ICD-10-CM

## 2022-03-16 DIAGNOSIS — F17210 Nicotine dependence, cigarettes, uncomplicated: Secondary | ICD-10-CM

## 2022-03-16 DIAGNOSIS — Z87891 Personal history of nicotine dependence: Secondary | ICD-10-CM

## 2022-04-20 ENCOUNTER — Ambulatory Visit
Admission: RE | Admit: 2022-04-20 | Discharge: 2022-04-20 | Disposition: A | Discharge status: Home / Self Care | Payer: 59 | Source: Ambulatory Visit | Attending: Acute Care | Admitting: Acute Care

## 2022-04-20 DIAGNOSIS — Z122 Encounter for screening for malignant neoplasm of respiratory organs: Secondary | ICD-10-CM | POA: Diagnosis present

## 2022-04-20 DIAGNOSIS — F17210 Nicotine dependence, cigarettes, uncomplicated: Secondary | ICD-10-CM | POA: Diagnosis present

## 2022-04-20 DIAGNOSIS — Z87891 Personal history of nicotine dependence: Secondary | ICD-10-CM | POA: Insufficient documentation

## 2022-04-21 ENCOUNTER — Telehealth: Payer: Self-pay | Admitting: Acute Care

## 2022-04-21 DIAGNOSIS — Z87891 Personal history of nicotine dependence: Secondary | ICD-10-CM

## 2022-04-21 DIAGNOSIS — R911 Solitary pulmonary nodule: Secondary | ICD-10-CM

## 2022-04-21 NOTE — Telephone Encounter (Signed)
Received a call report from [place] at [place] for patient's lung cancer screening CT. Below is a copy of the impression:   IMPRESSION: 1. Lung-RADS 3, probably benign findings. Short-term follow-up in 6 months is recommended with repeat low-dose chest CT without contrast (please use the following order, "CT CHEST LCS NODULE FOLLOW-UP W/O CM"). New 4.5 mm left lower lobe pulmonary nodule. 2.  Emphysema (ICD10-J43.9) and Aortic Atherosclerosis (ICD10-170.0)   These results will be called to the ordering clinician or representative by the Radiologist Assistant, and communication documented in the PACS or [employer].  Will route to Judson Roch and the lung cancer screening pool for follow up.

## 2022-04-21 NOTE — Telephone Encounter (Signed)
Spoke with pt and advised that previous lung nodules seen are stable in size. One new small nodule seen that we will look at again with a 6 month follow up scan. Pt verbalized understanding. Results/plans sent to PCP. Order placed for 6 month nodule f/u CT.

## 2022-09-01 IMAGING — CT CT ABD-PELV W/ CM
1 of 3 series · 13 of 32 positions shown, 18 images · IV contrast (APPLIED)
Comparison: Chest CT 11/18/2019.  Abdominal ultrasound 01/09/2020.

CLINICAL DATA: Nausea and vomiting since COVID booster shot.
History of right breast cancer with reconstructive surgery in 2222.

EXAM:
CT ABDOMEN AND PELVIS WITH CONTRAST
TECHNIQUE: Multidetector CT imaging of the abdomen and pelvis was performed
using the standard protocol following bolus administration of
intravenous contrast.
CONTRAST:  100mL OMNIPAQUE IOHEXOL 300 MG/ML  SOLN

[Series 2: axial st · axial · 0.71mm/px · z∈[-1206,-806]mm · 13 of 92 slices shown, 18 images]
[im 6/92  soft-tissue]
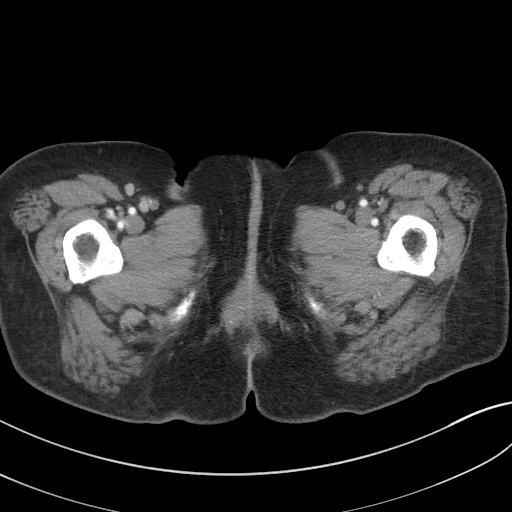
[im 6/92  bone]
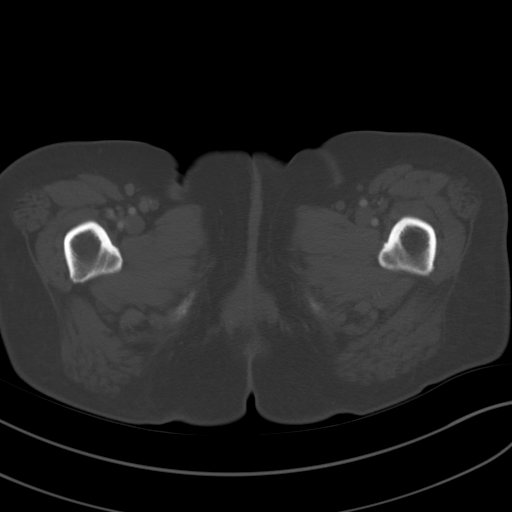
[im 17/92  soft-tissue]
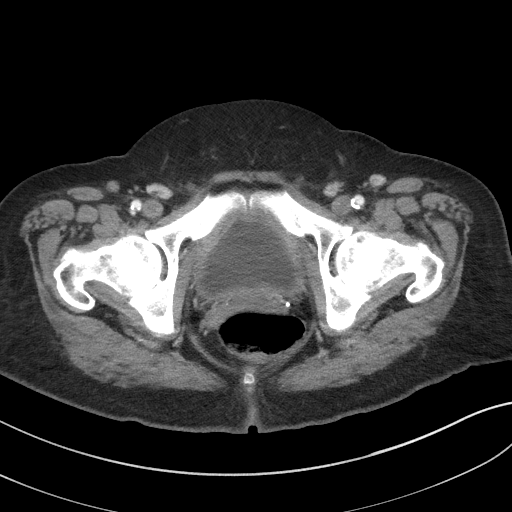
[im 22/92  soft-tissue]
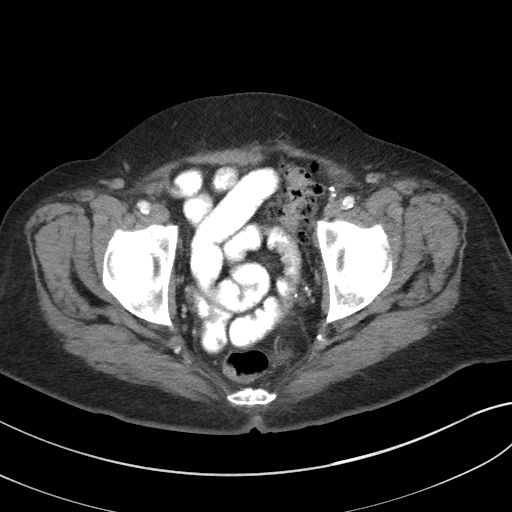
[im 27/92  soft-tissue]
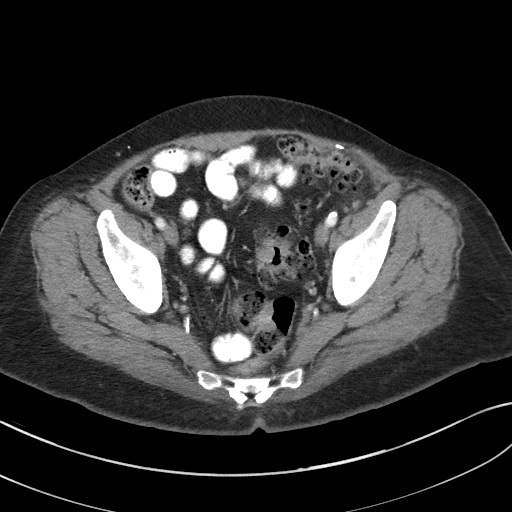
[im 38/92  soft-tissue]
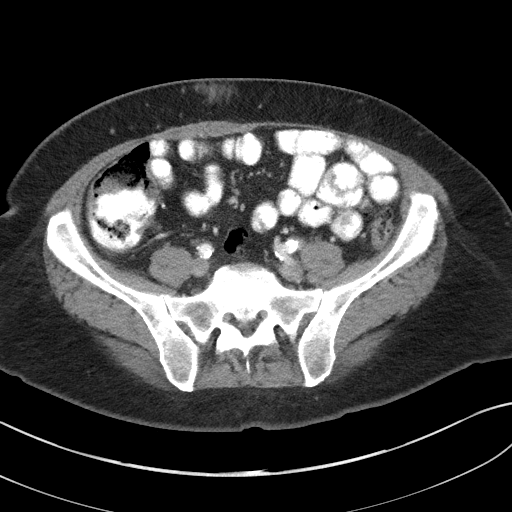
[im 43/92  soft-tissue]
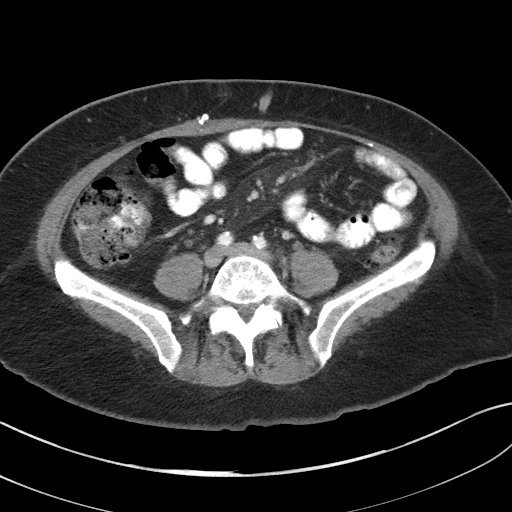
[im 49/92  soft-tissue]
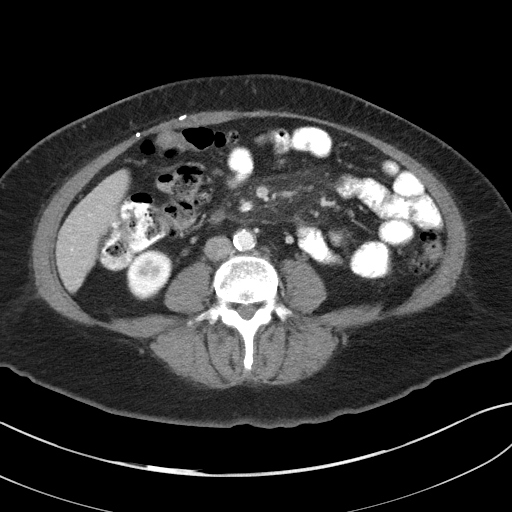
[im 59/92  soft-tissue]
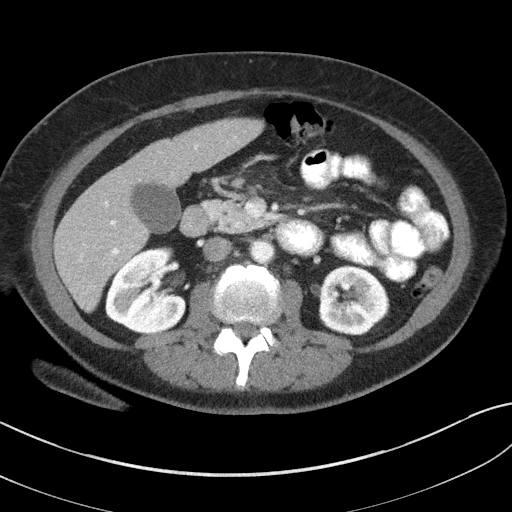
[im 65/92  soft-tissue]
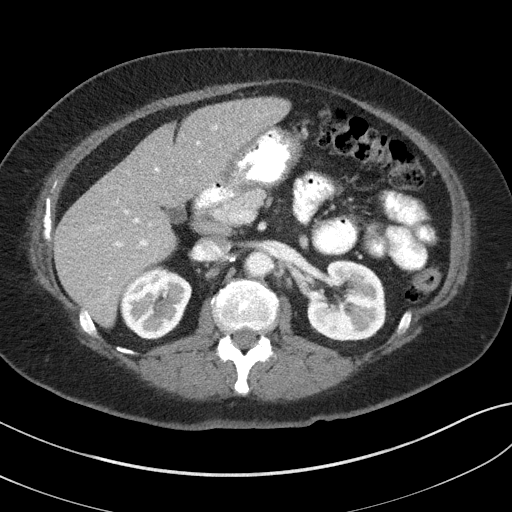
[im 65/92  bone]
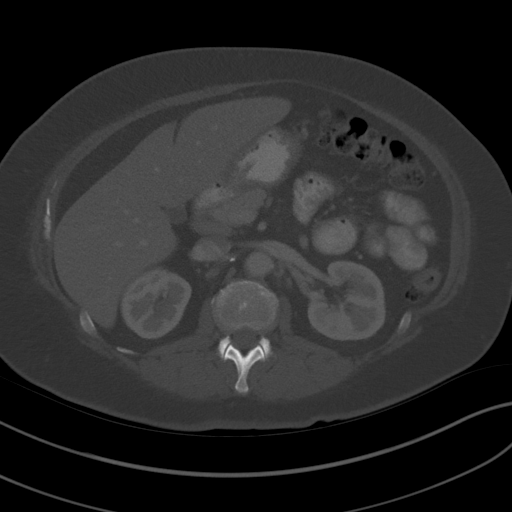
[im 70/92  soft-tissue]
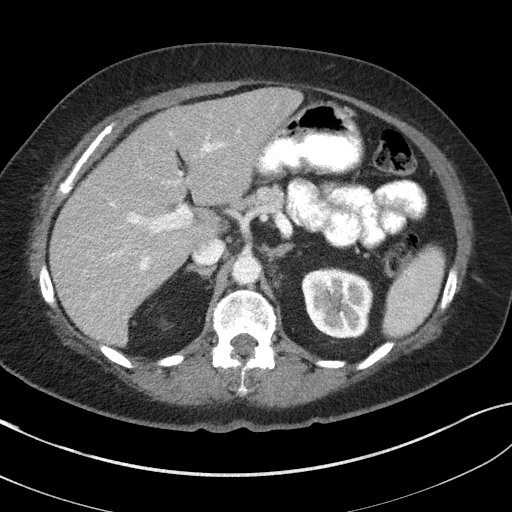
[im 70/92  lung]
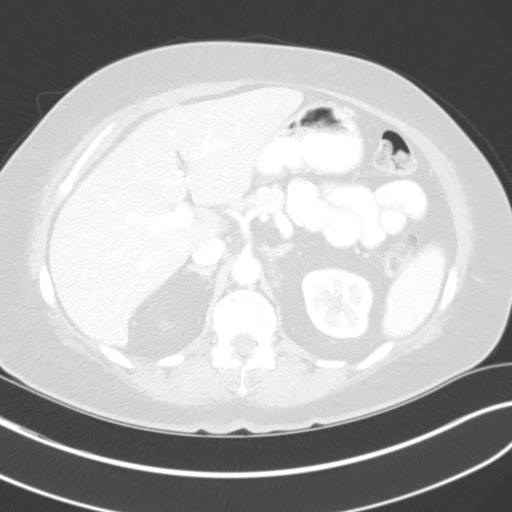
[im 75/92  lung]
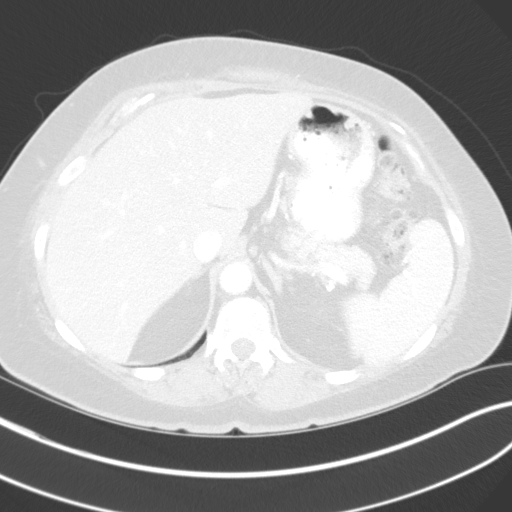
[im 81/92  soft-tissue]
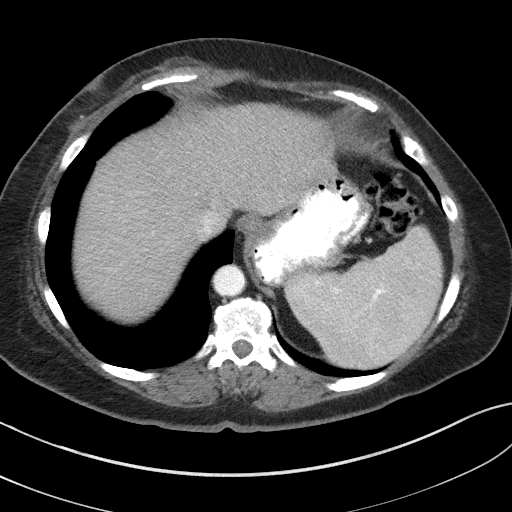
[im 81/92  lung]
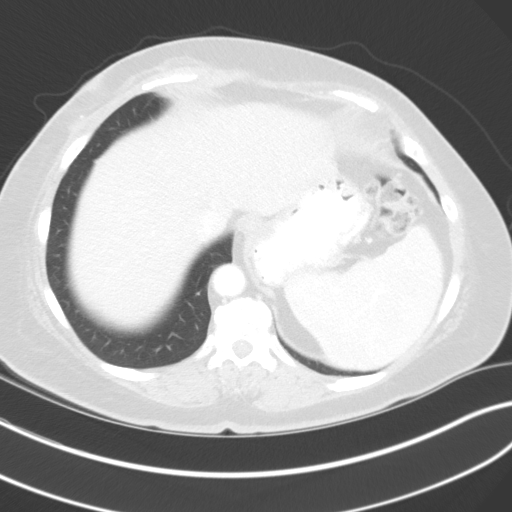
[im 86/92  soft-tissue]
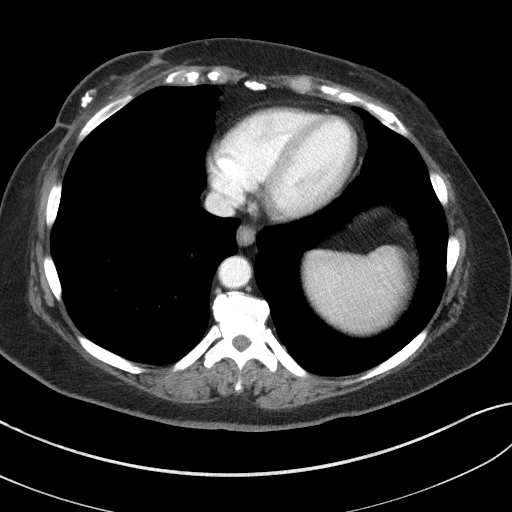
[im 86/92  lung]
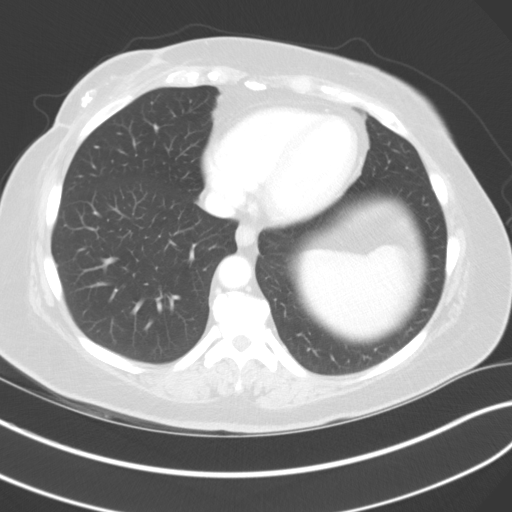

[13 of 32 positions shown; findings below may reference images not displayed]

FINDINGS: Lower chest: 2 mm subpleural right middle lobe nodule on image [DATE]
is unchanged, consistent with a benign finding. The lung bases are
otherwise clear. There are grossly stable postsurgical changes in
the right breast with associated calcifications. No recurrent mass
lesion identified.

Hepatobiliary: The liver is normal in density without suspicious
focal abnormality. No evidence of gallstones, gallbladder wall
thickening or biliary dilatation.

Pancreas: Unremarkable. No pancreatic ductal dilatation or
surrounding inflammatory changes.

Spleen: Normal in size without focal abnormality.

Adrenals/Urinary Tract: Both adrenal glands appear normal. The
kidneys appear normal without evidence of urinary tract calculus,
suspicious lesion or hydronephrosis. No bladder abnormalities are
seen.

Stomach/Bowel: No evidence of bowel wall thickening, distention or
surrounding inflammatory change. The appendix appears normal. There
are extensive diverticular changes throughout the descending and
sigmoid colon without evidence of acute inflammation.

Vascular/Lymphatic: There are no enlarged abdominal or pelvic lymph
nodes. There are scattered small retroperitoneal lymph nodes. There
is fairly extensive aortic and branch vessel atherosclerosis, but no
evidence of large vessel occlusion. The portal, superior mesenteric
and splenic veins are patent. There is a stable small (8 mm) splenic
artery aneurysm.

Reproductive: Hysterectomy.  No adnexal mass.

Other: Postsurgical changes in the anterior abdominal wall
attributed to breast reconstruction. There is soft tissue stranding
in the subcutaneous fat of the right periumbilical region which may
relate to subcutaneous injections. Additional postsurgical changes
in the left inguinal region likely related to prior hernia repair.
No ascites or peritoneal nodularity.

Musculoskeletal: No acute or significant osseous findings.
IMPRESSION: 1. No acute findings or explanation for the patient's symptoms.
2. Extensive colonic diverticulosis without evidence of acute
inflammation.
3. Aortic Atherosclerosis ([id number]).

## 2022-10-24 ENCOUNTER — Ambulatory Visit: Admission: RE | Admit: 2022-10-24 | Payer: 59 | Source: Ambulatory Visit

## 2023-01-06 ENCOUNTER — Other Ambulatory Visit: Payer: Self-pay | Admitting: Infectious Diseases

## 2023-01-06 DIAGNOSIS — I10 Essential (primary) hypertension: Secondary | ICD-10-CM

## 2023-01-06 DIAGNOSIS — Z122 Encounter for screening for malignant neoplasm of respiratory organs: Secondary | ICD-10-CM

## 2023-01-06 DIAGNOSIS — R911 Solitary pulmonary nodule: Secondary | ICD-10-CM

## 2023-01-13 ENCOUNTER — Ambulatory Visit
Admission: RE | Admit: 2023-01-13 | Discharge: 2023-01-13 | Disposition: A | Discharge status: Home / Self Care | Payer: 59 | Source: Ambulatory Visit | Attending: Infectious Diseases | Admitting: Infectious Diseases

## 2023-01-13 DIAGNOSIS — I10 Essential (primary) hypertension: Secondary | ICD-10-CM | POA: Insufficient documentation

## 2023-01-13 DIAGNOSIS — R911 Solitary pulmonary nodule: Secondary | ICD-10-CM | POA: Diagnosis present

## 2023-01-13 DIAGNOSIS — Z122 Encounter for screening for malignant neoplasm of respiratory organs: Secondary | ICD-10-CM | POA: Diagnosis present

## 2023-03-09 ENCOUNTER — Encounter: Payer: Self-pay | Admitting: Oncology

## 2023-03-09 ENCOUNTER — Inpatient Hospital Stay: Payer: 59

## 2023-03-09 ENCOUNTER — Inpatient Hospital Stay: Payer: 59 | Attending: Oncology | Admitting: Oncology

## 2023-03-09 VITALS — HR 71 | Temp 97.3°F | Ht 62.0 in | Wt 152.0 lb

## 2023-03-09 DIAGNOSIS — D751 Secondary polycythemia: Secondary | ICD-10-CM | POA: Insufficient documentation

## 2023-03-09 LAB — CBC (CANCER CENTER ONLY)
HCT: 50.1 % — ABNORMAL HIGH (ref 36.0–46.0)
Hemoglobin: 17.3 g/dL — ABNORMAL HIGH (ref 12.0–15.0)
MCH: 32.6 pg (ref 26.0–34.0)
MCHC: 34.5 g/dL (ref 30.0–36.0)
MCV: 94.4 fL (ref 80.0–100.0)
Platelet Count: 188 10*3/uL (ref 150–400)
RBC: 5.31 MIL/uL — ABNORMAL HIGH (ref 3.87–5.11)
RDW: 13.1 % (ref 11.5–15.5)
WBC Count: 7.3 10*3/uL (ref 4.0–10.5)
nRBC: 0 % (ref 0.0–0.2)

## 2023-03-09 LAB — FERRITIN: Ferritin: 47 ng/mL (ref 11–307)

## 2023-03-09 LAB — IRON AND TIBC
Iron: 85 ug/dL (ref 28–170)
Saturation Ratios: 19 % (ref 10.4–31.8)
TIBC: 456 ug/dL — ABNORMAL HIGH (ref 250–450)
UIBC: 371 ug/dL

## 2023-03-09 NOTE — Progress Notes (Unsigned)
[facility]  Telephone:([phone] [phone]) [phone]  ID: Melissa Hayes OB: 1960-10-19  MR#: [id number]  CBJ#:[id number]  Patient Care Team: Mick Sell, MD as PCP - General (Infectious Diseases)  CHIEF COMPLAINT: Polycythemia.  INTERVAL HISTORY: Patient is a 63 year old female who is noted to have a persistently elevated hemoglobin on routine blood work.  She is referred for further evaluation.  She currently feels well and is asymptomatic.  She has no neurologic complaints.  She denies any recent fevers or illnesses.  She has a good appetite and denies weight loss.  She has no chest pain, shortness of breath, cough, or hemoptysis.  She denies any nausea, vomiting, constipation, or diarrhea.  She has no urinary complaints.  Patient offers no specific complaints today.    REVIEW OF SYSTEMS:   Review of Systems  Constitutional: Negative.  Negative for fever, malaise/fatigue and weight loss.  Respiratory: Negative.  Negative for cough, hemoptysis and shortness of breath.   Cardiovascular: Negative.  Negative for chest pain and leg swelling.  Gastrointestinal: Negative.  Negative for abdominal pain.  Genitourinary: Negative.  Negative for frequency.  Musculoskeletal: Negative.  Negative for back pain.  Skin: Negative.  Negative for rash.  Neurological: Negative.  Negative for dizziness, focal weakness, weakness and headaches.  Psychiatric/Behavioral: Negative.  The patient is not nervous/anxious.     As per HPI. Otherwise, a complete review of systems is negative.  PAST MEDICAL HISTORY: Past Medical History:  Diagnosis Date   Breast cancer (HCC)    Cataract    Diabetes (HCC)    Hypertension    Stroke (HCC)     PAST SURGICAL HISTORY: Past Surgical History:  Procedure Laterality Date   ABDOMINAL HYSTERECTOMY     BREAST SURGERY     BUNIONECTOMY  2005   RECONSTRUCTION BREAST W/ TRAM FLAP  07/2014   TUBAL LIGATION      FAMILY HISTORY: Family  History  Problem Relation Age of Onset   Diabetes Mother    Hypertension Mother    Diabetes Father    Hypertension Father    Diabetes Maternal Grandmother    Hypertension Maternal Grandmother    Diabetes Paternal Grandmother    Hypertension Paternal Grandmother    Breast cancer Maternal Aunt     ADVANCED DIRECTIVES (Y/N):  N  HEALTH MAINTENANCE: Social History   Tobacco Use   Smoking status: Former    Current packs/day: 0.00    Average packs/day: 2.0 packs/day for 40.0 years (80.0 ttl pk-yrs)    Types: Cigarettes    Start date: 89    Quit date: 2016    Years since quitting: 9.1   Smokeless tobacco: Never  Vaping Use   Vaping status: Every Day   Substances: Nicotine  Substance Use Topics   Alcohol use: Not Currently   Drug use: Never     Colonoscopy:  PAP:  Bone density:  Lipid panel:  No Known Allergies  Current Outpatient Medications  Medication Sig Dispense Refill   aspirin EC 81 MG tablet Take 81 mg by mouth daily.     glucose blood test strip USE 1 STRIP TO TEST BLOOD SUGAR DAILY AS DIRECTED.     hydrochlorothiazide (HYDRODIURIL) 12.5 MG tablet TAKE 1 TABLET BY MOUTH EVERY DAY 90 tablet 0   JARDIANCE 10 MG TABS tablet TAKE 1 TABLET BY MOUTH EVERY DAY 90 tablet 0   OZEMPIC, 0.25 OR 0.5 MG/DOSE, 2 MG/3ML SOPN SMARTSIG:0.75 Milliliter(s) SUB-Q Once a Week  PARoxetine (PAXIL) 10 MG tablet Take 1 tablet (10 mg total) by mouth daily. Please schedule an office visit before anymore refills. 30 tablet 0   rosuvastatin (CRESTOR) 20 MG tablet TAKE 1 TABLET BY MOUTH EVERY DAY 90 tablet 1   valsartan (DIOVAN) 80 MG tablet Take 1 tablet (80 mg total) by mouth daily. Please schedule an office visit before anymore refills. 30 tablet 0   metFORMIN (GLUCOPHAGE) 1000 MG tablet TAKE 1 TABLET (1,000 MG TOTAL) BY MOUTH 2 (TWO) TIMES DAILY WITH A MEAL. (Patient not taking: Reported on 03/09/2023) 180 tablet 0   XULTOPHY 100-3.6 UNIT-MG/ML SOPN INJECT 36 UNITS INTO THE SKIN AT  BEDTIME. (Patient not taking: Reported on 03/09/2023) 30 pen 0   No current facility-administered medications for this visit.    OBJECTIVE: Vitals:   03/09/23 1507  Pulse: 71  Temp: (!) 97.3 F (36.3 C)  SpO2: 97%     Body mass index is 27.8 kg/m.    ECOG FS:0 - Asymptomatic  General: Well-developed, well-nourished, no acute distress. Eyes: Pink conjunctiva, anicteric sclera. HEENT: Normocephalic, moist mucous membranes. Lungs: No audible wheezing or coughing. Heart: Regular rate and rhythm. Abdomen: Soft, nontender, no obvious distention. Musculoskeletal: No edema, cyanosis, or clubbing. Neuro: Alert, answering all questions appropriately. Cranial nerves grossly intact. Skin: No rashes or petechiae noted. Psych: Normal affect. Lymphatics: No cervical, calvicular, axillary or inguinal LAD.   LAB RESULTS:  Lab Results  Component Value Date   NA 143 07/26/2018   K 3.8 07/26/2018   CL 100 07/26/2018   CO2 23 07/26/2018   GLUCOSE 143 (H) 07/26/2018   BUN 8 07/26/2018   CREATININE 1.00 07/26/2018   CALCIUM 9.9 07/26/2018   PROT 6.8 07/26/2018   ALBUMIN 4.3 07/26/2018   AST 12 07/26/2018   ALT 11 07/26/2018   ALKPHOS 35 (L) 07/26/2018   BILITOT 0.6 07/26/2018   GFRNONAA 63 07/26/2018   GFRAA 72 07/26/2018    Lab Results  Component Value Date   WBC 7.3 03/09/2023   NEUTROABS 5.2 07/26/2018   HGB 17.3 (H) 03/09/2023   HCT 50.1 (H) 03/09/2023   MCV 94.4 03/09/2023   PLT 188 03/09/2023   Lab Results  Component Value Date   IRON 85 03/09/2023   TIBC 456 (H) 03/09/2023   IRONPCTSAT 19 03/09/2023   Lab Results  Component Value Date   FERRITIN 47 03/09/2023     STUDIES: No results found.  ASSESSMENT: Polycythemia.  PLAN:    Polycythemia: Patient's hemoglobin is only mildly elevated at 17.3.  Her hemoglobin has ranged between 16.5 and 18.0 since October 2022.  JAK2 mutation with reflex is negative.  Iron stores are within normal limits.  All of her other  laboratory work is pending at time of dictation.  No intervention is needed at this time.  Patient does not require bone marrow biopsy or phlebotomy.  She will return to clinic in 3 weeks with video-assisted telemedicine visit for further evaluation and discussion of her laboratory results.  I spent a total of 45 minutes reviewing chart data, face-to-face evaluation with the patient, counseling and coordination of care as detailed above.   Patient expressed understanding and was in agreement with this plan. She also understands that She can call clinic at any time with any questions, concerns, or complaints.    Jeralyn Ruths, MD   03/10/2023 12:11 PM

## 2023-03-10 LAB — CARBON MONOXIDE, BLOOD (PERFORMED AT REF LAB): Carbon Monoxide, Blood: 14.1 % — ABNORMAL HIGH (ref 0.0–3.6)

## 2023-03-10 LAB — ERYTHROPOIETIN: Erythropoietin: 13.1 m[IU]/mL (ref 2.6–18.5)

## 2023-03-30 ENCOUNTER — Inpatient Hospital Stay: Payer: 59 | Attending: Oncology | Admitting: Oncology

## 2023-03-30 DIAGNOSIS — D751 Secondary polycythemia: Secondary | ICD-10-CM

## 2023-03-30 NOTE — Progress Notes (Signed)
 [facility]  Telephone:(336) [phone] Fax:(336) [phone]  ID: Melissa Hayes OB: [birth year]  MR#: [id number]  UXL#:[id number]  Patient Care Team: Mick Sell, MD as PCP - General (Infectious Diseases)  I connected with [name] on 03/30/23 at 10:30 AM EST by video enabled telemedicine visit and verified that I am speaking with the correct person using two identifiers.   I discussed the limitations, risks, security and privacy concerns of performing an evaluation and management service by telemedicine and the availability of in-person appointments. I also discussed with the patient that there may be a patient responsible charge related to this service. The patient expressed understanding and agreed to proceed.   Other persons participating in the visit and their role in the encounter: Patient, MD.  Patient's location: Home. Provider's location: Clinic.  CHIEF COMPLAINT: Polycythemia.  INTERVAL HISTORY: Patient agreed to video-assisted telemedicine visit for further evaluation and discussion of her laboratory results.  She continues to feel well and remains asymptomatic. She has no neurologic complaints.  She denies any recent fevers or illnesses.  She has a good appetite and denies weight loss.  She has no chest pain, shortness of breath, cough, or hemoptysis.  She denies any nausea, vomiting, constipation, or diarrhea.  She has no urinary complaints.  Patient offers no specific complaints today.  REVIEW OF SYSTEMS:   Review of Systems  Constitutional: Negative.  Negative for fever, malaise/fatigue and weight loss.  Respiratory: Negative.  Negative for cough, hemoptysis and shortness of breath.   Cardiovascular: Negative.  Negative for chest pain and leg swelling.  Gastrointestinal: Negative.  Negative for abdominal pain.  Genitourinary: Negative.  Negative for frequency.  Musculoskeletal: Negative.  Negative for back pain.  Skin: Negative.   Negative for rash.  Neurological: Negative.  Negative for dizziness, focal weakness, weakness and headaches.  Psychiatric/Behavioral: Negative.  The patient is not nervous/anxious.     As per HPI. Otherwise, a complete review of systems is negative.  PAST MEDICAL HISTORY: Past Medical History:  Diagnosis Date   Breast cancer (HCC)    Cataract    Diabetes (HCC)    Hypertension    Stroke (HCC)     PAST SURGICAL HISTORY: Past Surgical History:  Procedure Laterality Date   ABDOMINAL HYSTERECTOMY     BREAST SURGERY     BUNIONECTOMY  2005   RECONSTRUCTION BREAST W/ TRAM FLAP  07/2014   TUBAL LIGATION      FAMILY HISTORY: Family History  Problem Relation Age of Onset   Diabetes Mother    Hypertension Mother    Diabetes Father    Hypertension Father    Diabetes Maternal Grandmother    Hypertension Maternal Grandmother    Diabetes Paternal Grandmother    Hypertension Paternal Grandmother    Breast cancer Maternal Aunt     ADVANCED DIRECTIVES (Y/N):  N  HEALTH MAINTENANCE: Social History   Tobacco Use   Smoking status: Former    Current packs/day: 0.00    Average packs/day: 2.0 packs/day for 40.0 years (80.0 ttl pk-yrs)    Types: Cigarettes    Start date: 37    Quit date: 2016    Years since quitting: 9.1   Smokeless tobacco: Never  Vaping Use   Vaping status: Every Day   Substances: Nicotine  Substance Use Topics   Alcohol use: Not Currently   Drug use: Never     Colonoscopy:  PAP:  Bone density:  Lipid panel:  No Known Allergies  Current  Outpatient Medications  Medication Sig Dispense Refill   aspirin EC 81 MG tablet Take 81 mg by mouth daily.     glucose blood test strip USE 1 STRIP TO TEST BLOOD SUGAR DAILY AS DIRECTED.     hydrochlorothiazide (HYDRODIURIL) 12.5 MG tablet TAKE 1 TABLET BY MOUTH EVERY DAY 90 tablet 0   JARDIANCE 10 MG TABS tablet TAKE 1 TABLET BY MOUTH EVERY DAY 90 tablet 0   metFORMIN (GLUCOPHAGE) 1000 MG tablet TAKE 1 TABLET  (1,000 MG TOTAL) BY MOUTH 2 (TWO) TIMES DAILY WITH A MEAL. (Patient not taking: Reported on 03/09/2023) 180 tablet 0   OZEMPIC, 0.25 OR 0.5 MG/DOSE, 2 MG/3ML SOPN SMARTSIG:0.75 Milliliter(s) SUB-Q Once a Week     PARoxetine (PAXIL) 10 MG tablet Take 1 tablet (10 mg total) by mouth daily. Please schedule an office visit before anymore refills. 30 tablet 0   rosuvastatin (CRESTOR) 20 MG tablet TAKE 1 TABLET BY MOUTH EVERY DAY 90 tablet 1   valsartan (DIOVAN) 80 MG tablet Take 1 tablet (80 mg total) by mouth daily. Please schedule an office visit before anymore refills. 30 tablet 0   XULTOPHY 100-3.6 UNIT-MG/ML SOPN INJECT 36 UNITS INTO THE SKIN AT BEDTIME. (Patient not taking: Reported on 03/09/2023) 30 pen 0   No current facility-administered medications for this visit.    OBJECTIVE: There were no vitals filed for this visit.    There is no height or weight on file to calculate BMI.    ECOG FS:0 - Asymptomatic  General: Well-developed, well-nourished, no acute distress. HEENT: Normocephalic. Neuro: Alert, answering all questions appropriately. Cranial nerves grossly intact. Psych: Normal affect.  LAB RESULTS:  Lab Results  Component Value Date   NA 143 07/26/2018   K 3.8 07/26/2018   CL 100 07/26/2018   CO2 23 07/26/2018   GLUCOSE 143 (H) 07/26/2018   BUN 8 07/26/2018   CREATININE 1.00 07/26/2018   CALCIUM 9.9 07/26/2018   PROT 6.8 07/26/2018   ALBUMIN 4.3 07/26/2018   AST 12 07/26/2018   ALT 11 07/26/2018   ALKPHOS 35 (L) 07/26/2018   BILITOT 0.6 07/26/2018   GFRNONAA 63 07/26/2018   GFRAA 72 07/26/2018    Lab Results  Component Value Date   WBC 7.3 03/09/2023   NEUTROABS 5.2 07/26/2018   HGB 17.3 (H) 03/09/2023   HCT 50.1 (H) 03/09/2023   MCV 94.4 03/09/2023   PLT 188 03/09/2023   Lab Results  Component Value Date   IRON 85 03/09/2023   TIBC 456 (H) 03/09/2023   IRONPCTSAT 19 03/09/2023   Lab Results  Component Value Date   FERRITIN 47 03/09/2023      STUDIES: No results found.  ASSESSMENT: Polycythemia.  PLAN:    Polycythemia: Secondary to tobacco use.  Patient's hemoglobin is only mildly elevated at 17.3.  Her carboxyhemoglobin level is 18.  All of her other laboratory work including iron stores and JAK2 mutation with reflex are either negative or within normal limits.  No intervention is needed at this time.  Patient has been instructed that smoking cessation will likely improve her hemoglobin level.  No further follow-up has been scheduled.   Smoking cessation: Patient states she is attempting to quit and has nicotine gum on hand.    I provided 20 minutes of face-to-face video visit time during this encounter which included chart review, counseling, and coordination of care as documented above.   Patient expressed understanding and was in agreement with this plan. She also understands that  She can call clinic at any time with any questions, concerns, or complaints.    Jeralyn Ruths, MD   03/30/2023 1:27 PM

## 2023-06-07 ENCOUNTER — Encounter: Payer: Self-pay | Admitting: Infectious Diseases

## 2023-06-07 ENCOUNTER — Other Ambulatory Visit: Payer: Self-pay | Admitting: Infectious Diseases

## 2023-06-07 DIAGNOSIS — Z1231 Encounter for screening mammogram for malignant neoplasm of breast: Secondary | ICD-10-CM

## 2023-11-22 ENCOUNTER — Inpatient Hospital Stay
Admission: RE | Admit: 2023-11-22 | Discharge: 2023-11-22 | Disposition: A | Discharge status: Home / Self Care | Payer: Self-pay | Source: Ambulatory Visit | Attending: Infectious Diseases | Admitting: Infectious Diseases

## 2023-11-22 ENCOUNTER — Other Ambulatory Visit: Payer: Self-pay | Admitting: *Deleted

## 2023-11-22 DIAGNOSIS — Z1231 Encounter for screening mammogram for malignant neoplasm of breast: Secondary | ICD-10-CM

## 2024-02-14 ENCOUNTER — Other Ambulatory Visit: Payer: Self-pay | Admitting: Infectious Diseases

## 2024-02-14 ENCOUNTER — Ambulatory Visit
Admission: RE | Admit: 2024-02-14 | Discharge: 2024-02-14 | Disposition: A | Discharge status: Home / Self Care | Source: Ambulatory Visit | Attending: Infectious Diseases | Admitting: Infectious Diseases

## 2024-02-14 DIAGNOSIS — Z1231 Encounter for screening mammogram for malignant neoplasm of breast: Secondary | ICD-10-CM | POA: Insufficient documentation
